# Patient Record
Sex: Male | Born: 1955 | Race: White | Hispanic: No | Marital: Married | Smoking: Former smoker
Health system: Southern US, Community
[De-identification: ages and names within clinical notes are randomized; demographics above are authoritative.]

## PROBLEM LIST (undated history)

## (undated) DIAGNOSIS — E2749 Other adrenocortical insufficiency: Secondary | ICD-10-CM

## (undated) DIAGNOSIS — R5383 Other fatigue: Secondary | ICD-10-CM

## (undated) DIAGNOSIS — I219 Acute myocardial infarction, unspecified: Secondary | ICD-10-CM

## (undated) DIAGNOSIS — I2510 Atherosclerotic heart disease of native coronary artery without angina pectoris: Secondary | ICD-10-CM

## (undated) DIAGNOSIS — IMO0001 Reserved for inherently not codable concepts without codable children: Secondary | ICD-10-CM

## (undated) DIAGNOSIS — Z905 Acquired absence of kidney: Secondary | ICD-10-CM

## (undated) DIAGNOSIS — R413 Other amnesia: Secondary | ICD-10-CM

## (undated) DIAGNOSIS — J349 Unspecified disorder of nose and nasal sinuses: Secondary | ICD-10-CM

## (undated) DIAGNOSIS — D496 Neoplasm of unspecified behavior of brain: Secondary | ICD-10-CM

## (undated) DIAGNOSIS — Z9889 Other specified postprocedural states: Secondary | ICD-10-CM

## (undated) DIAGNOSIS — K5732 Diverticulitis of large intestine without perforation or abscess without bleeding: Secondary | ICD-10-CM

## (undated) DIAGNOSIS — C410 Malignant neoplasm of bones of skull and face: Secondary | ICD-10-CM

## (undated) DIAGNOSIS — R0600 Dyspnea, unspecified: Secondary | ICD-10-CM

## (undated) DIAGNOSIS — K439 Ventral hernia without obstruction or gangrene: Secondary | ICD-10-CM

## (undated) DIAGNOSIS — Z951 Presence of aortocoronary bypass graft: Secondary | ICD-10-CM

## (undated) HISTORY — PX: CARDIAC CATHETERIZATION: SHX172

## (undated) HISTORY — DX: Malignant neoplasm of bones of skull and face: C41.0

## (undated) HISTORY — PX: HERNIA REPAIR: SHX51

## (undated) HISTORY — PX: BRAIN SURGERY: SHX531

## (undated) HISTORY — DX: Dyspnea, unspecified: R06.00

## (undated) HISTORY — DX: Atherosclerotic heart disease of native coronary artery without angina pectoris: I25.10

## (undated) HISTORY — DX: Acquired absence of kidney: Z90.5

## (undated) HISTORY — DX: Reserved for inherently not codable concepts without codable children: IMO0001

## (undated) HISTORY — PX: CORONARY ARTERY BYPASS GRAFT: SHX141

## (undated) HISTORY — DX: Other amnesia: R41.3

## (undated) HISTORY — PX: COLOSTOMY: SHX63

## (undated) HISTORY — DX: Other adrenocortical insufficiency: E27.49

## (undated) HISTORY — DX: Other specified postprocedural states: Z98.890

## (undated) HISTORY — DX: Ventral hernia without obstruction or gangrene: K43.9

## (undated) HISTORY — DX: Unspecified disorder of nose and nasal sinuses: J34.9

## (undated) HISTORY — PX: FRACTURE SURGERY: SHX138

## (undated) HISTORY — DX: Other fatigue: R53.83

---

## 2015-01-28 ENCOUNTER — Encounter (HOSPITAL_COMMUNITY): Payer: Self-pay | Admitting: *Deleted

## 2015-01-28 ENCOUNTER — Inpatient Hospital Stay (HOSPITAL_COMMUNITY)
Admission: AD | Admit: 2015-01-28 | Discharge: 2015-02-10 | DRG: 233 | Disposition: A | Discharge status: Home / Self Care | Payer: Medicare Other | Source: Other Acute Inpatient Hospital | Attending: Cardiothoracic Surgery | Admitting: Cardiothoracic Surgery

## 2015-01-28 DIAGNOSIS — F17210 Nicotine dependence, cigarettes, uncomplicated: Secondary | ICD-10-CM | POA: Diagnosis present

## 2015-01-28 DIAGNOSIS — D696 Thrombocytopenia, unspecified: Secondary | ICD-10-CM | POA: Diagnosis not present

## 2015-01-28 DIAGNOSIS — C717 Malignant neoplasm of brain stem: Secondary | ICD-10-CM | POA: Diagnosis present

## 2015-01-28 DIAGNOSIS — E785 Hyperlipidemia, unspecified: Secondary | ICD-10-CM | POA: Diagnosis not present

## 2015-01-28 DIAGNOSIS — F17290 Nicotine dependence, other tobacco product, uncomplicated: Secondary | ICD-10-CM | POA: Diagnosis present

## 2015-01-28 DIAGNOSIS — Z79899 Other long term (current) drug therapy: Secondary | ICD-10-CM

## 2015-01-28 DIAGNOSIS — I209 Angina pectoris, unspecified: Secondary | ICD-10-CM | POA: Diagnosis not present

## 2015-01-28 DIAGNOSIS — Y92234 Operating room of hospital as the place of occurrence of the external cause: Secondary | ICD-10-CM

## 2015-01-28 DIAGNOSIS — I4891 Unspecified atrial fibrillation: Secondary | ICD-10-CM | POA: Diagnosis present

## 2015-01-28 DIAGNOSIS — Z951 Presence of aortocoronary bypass graft: Secondary | ICD-10-CM | POA: Diagnosis not present

## 2015-01-28 DIAGNOSIS — Z720 Tobacco use: Secondary | ICD-10-CM | POA: Diagnosis present

## 2015-01-28 DIAGNOSIS — J95811 Postprocedural pneumothorax: Secondary | ICD-10-CM | POA: Diagnosis not present

## 2015-01-28 DIAGNOSIS — R0602 Shortness of breath: Secondary | ICD-10-CM

## 2015-01-28 DIAGNOSIS — D496 Neoplasm of unspecified behavior of brain: Secondary | ICD-10-CM | POA: Diagnosis not present

## 2015-01-28 DIAGNOSIS — E8770 Fluid overload, unspecified: Secondary | ICD-10-CM | POA: Diagnosis not present

## 2015-01-28 DIAGNOSIS — R0789 Other chest pain: Secondary | ICD-10-CM | POA: Diagnosis not present

## 2015-01-28 DIAGNOSIS — J939 Pneumothorax, unspecified: Secondary | ICD-10-CM

## 2015-01-28 DIAGNOSIS — Z7982 Long term (current) use of aspirin: Secondary | ICD-10-CM | POA: Diagnosis not present

## 2015-01-28 DIAGNOSIS — I214 Non-ST elevation (NSTEMI) myocardial infarction: Secondary | ICD-10-CM | POA: Diagnosis present

## 2015-01-28 DIAGNOSIS — I25710 Atherosclerosis of autologous vein coronary artery bypass graft(s) with unstable angina pectoris: Principal | ICD-10-CM | POA: Diagnosis present

## 2015-01-28 DIAGNOSIS — Z9689 Presence of other specified functional implants: Secondary | ICD-10-CM

## 2015-01-28 DIAGNOSIS — L270 Generalized skin eruption due to drugs and medicaments taken internally: Secondary | ICD-10-CM | POA: Diagnosis not present

## 2015-01-28 DIAGNOSIS — I2510 Atherosclerotic heart disease of native coronary artery without angina pectoris: Secondary | ICD-10-CM | POA: Diagnosis not present

## 2015-01-28 DIAGNOSIS — Y832 Surgical operation with anastomosis, bypass or graft as the cause of abnormal reaction of the patient, or of later complication, without mention of misadventure at the time of the procedure: Secondary | ICD-10-CM | POA: Diagnosis not present

## 2015-01-28 DIAGNOSIS — T8089XA Other complications following infusion, transfusion and therapeutic injection, initial encounter: Secondary | ICD-10-CM | POA: Diagnosis not present

## 2015-01-28 DIAGNOSIS — T457X5A Adverse effect of anticoagulant antagonists, vitamin K and other coagulants, initial encounter: Secondary | ICD-10-CM | POA: Diagnosis not present

## 2015-01-28 DIAGNOSIS — I252 Old myocardial infarction: Secondary | ICD-10-CM | POA: Diagnosis not present

## 2015-01-28 DIAGNOSIS — I25110 Atherosclerotic heart disease of native coronary artery with unstable angina pectoris: Secondary | ICD-10-CM | POA: Diagnosis not present

## 2015-01-28 DIAGNOSIS — R079 Chest pain, unspecified: Secondary | ICD-10-CM

## 2015-01-28 DIAGNOSIS — D62 Acute posthemorrhagic anemia: Secondary | ICD-10-CM | POA: Diagnosis not present

## 2015-01-28 DIAGNOSIS — I213 ST elevation (STEMI) myocardial infarction of unspecified site: Secondary | ICD-10-CM

## 2015-01-28 DIAGNOSIS — N183 Chronic kidney disease, stage 3 (moderate): Secondary | ICD-10-CM | POA: Diagnosis present

## 2015-01-28 DIAGNOSIS — Z01810 Encounter for preprocedural cardiovascular examination: Secondary | ICD-10-CM | POA: Diagnosis not present

## 2015-01-28 DIAGNOSIS — Z4682 Encounter for fitting and adjustment of non-vascular catheter: Secondary | ICD-10-CM

## 2015-01-28 HISTORY — DX: Neoplasm of unspecified behavior of brain: D49.6

## 2015-01-28 HISTORY — DX: Presence of aortocoronary bypass graft: Z95.1

## 2015-01-28 HISTORY — DX: Diverticulitis of large intestine without perforation or abscess without bleeding: K57.32

## 2015-01-28 HISTORY — DX: Acute myocardial infarction, unspecified: I21.9

## 2015-01-28 MED ORDER — HEPARIN (PORCINE) IN NACL 100-0.45 UNIT/ML-% IJ SOLN
1300.0000 [IU]/h | INTRAMUSCULAR | Status: DC
  Administered 2015-01-29: 1300 [IU]/h via INTRAVENOUS
  Filled 2015-01-28 (×2): qty 250

## 2015-01-28 MED ORDER — NITROGLYCERIN 0.4 MG SL SUBL: 0.4000 mg | SUBLINGUAL_TABLET | SUBLINGUAL | Status: DC | PRN

## 2015-01-28 MED ORDER — ONDANSETRON HCL 4 MG/2ML IJ SOLN: 4.0000 mg | Freq: Four times a day (QID) | INTRAMUSCULAR | Status: DC | PRN

## 2015-01-28 MED ORDER — HYDROCODONE-ACETAMINOPHEN 10-325 MG PO TABS
1.0000 | ORAL_TABLET | ORAL | Status: DC | PRN
  Administered 2015-01-28 – 2015-01-29 (×4): 2 via ORAL
  Filled 2015-01-28 (×4): qty 2

## 2015-01-28 MED ORDER — ASPIRIN EC 81 MG PO TBEC
81.0000 mg | DELAYED_RELEASE_TABLET | Freq: Every day | ORAL | Status: DC
  Administered 2015-01-29 – 2015-02-03 (×6): 81 mg via ORAL
  Filled 2015-01-28 (×6): qty 1

## 2015-01-28 MED ORDER — ZOLPIDEM TARTRATE 5 MG PO TABS
5.0000 mg | ORAL_TABLET | Freq: Every evening | ORAL | Status: DC | PRN
  Administered 2015-01-28 – 2015-02-02 (×6): 5 mg via ORAL
  Filled 2015-01-28 (×6): qty 1

## 2015-01-28 MED ORDER — ACETAMINOPHEN 325 MG PO TABS: 650.0000 mg | ORAL_TABLET | ORAL | Status: DC | PRN

## 2015-01-28 MED ORDER — CETYLPYRIDINIUM CHLORIDE 0.05 % MT LIQD
7.0000 mL | Freq: Two times a day (BID) | OROMUCOSAL | Status: DC
  Administered 2015-02-01: 7 mL via OROMUCOSAL

## 2015-01-28 MED ORDER — ATORVASTATIN CALCIUM 80 MG PO TABS
80.0000 mg | ORAL_TABLET | Freq: Every day | ORAL | Status: DC
  Administered 2015-01-28 – 2015-02-09 (×12): 80 mg via ORAL
  Filled 2015-01-28 (×16): qty 1

## 2015-01-28 NOTE — H&P (Addendum)
[name] is an 59 y.o. male.   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 By the sixth hole he was just in the cart with his friends. He then was at the clubhouse and drove himself to the hospital. He characterized the pain as substernal to left-sided, pressure/tightness with some associated left arm pain. He thinks his pain with his MI and subsequent CABG 10 years ago was similar. He was treated in the ER with aspirin and lovenox and per history they recommended heart catheterization then but he and his wife preferred to come to [facility]. On arrival, he was chest pain free. However, when I reviewed the ECG I noted inferior ST elevation with reciprocal ST depression. He was chest pain free and has been for hours. Repeat ECG with inferior forces completed Q'ed out.   Past Medical History  Diagnosis Date  . Brain tumor     Clival Chordoma   . S/P CABG x 4     2006  . Myocardial infarction   . Diverticulitis large intestine     Past Surgical History  Procedure Laterality Date  . Coronary artery bypass graft    . Cardiac catheterization    . Brain surgery    . Hernia repair    . Fracture surgery      History reviewed. No pertinent family history. Social History:  reports that he has been smoking Cigarettes and E-cigarettes.  He has a 40 pack-year smoking history. He does not have any smokeless tobacco history on file. He reports that he does not drink alcohol or use illicit drugs.  Allergies: No Known Allergies  No prescriptions prior to admission    No results found for this or any previous visit (from the past 48 hour(s)). No results found.  Review of Systems  Constitutional: Positive for diaphoresis.  Negative for fever, chills and weight loss.  HENT: Negative for ear pain and tinnitus.   Eyes: Negative for double vision and photophobia.  Respiratory: Negative for cough and hemoptysis.   Cardiovascular: Positive for chest pain. Negative for palpitations, orthopnea and leg swelling.  Gastrointestinal: Positive for nausea and vomiting. Negative for heartburn, diarrhea and constipation.  Genitourinary: Negative for dysuria, urgency and frequency.  Musculoskeletal: Negative for myalgias and neck pain.  Skin: Negative for rash.  Neurological: Positive for headaches. Negative for dizziness, tingling and tremors.  Endo/Heme/Allergies: Negative for polydipsia. Does not bruise/bleed easily.  Psychiatric/Behavioral: Negative for depression, suicidal ideas and substance abuse.    Blood pressure 102/81, pulse 45, temperature 97.8 F (36.6 C), temperature source Core (Comment), resp. rate 16, height 6\' 2"  (1.88 m), weight 96.4 kg (212 lb 8.4 oz), SpO2 100 %. Physical Exam  Nursing note and vitals reviewed. Constitutional: He is oriented to person, place, and time. He appears well-developed and well-nourished. No distress.  HENT:  Head: Normocephalic and atraumatic.  Nose: Nose normal.  Mouth/Throat: Oropharynx is clear and moist. No oropharyngeal exudate.  Eyes: Conjunctivae and EOM are normal. Pupils are equal, round, and reactive to light. No scleral icterus.  Neck: Normal range of motion. Neck supple. No JVD present. No tracheal deviation present.  Cardiovascular: Regular rhythm, normal heart sounds and intact distal pulses.  Exam reveals  no gallop.   No murmur heard. Sinus bradycardia  Respiratory: Effort normal and breath sounds normal. No respiratory distress. He has no wheezes. He has no rales.  GI: Soft. Bowel sounds are normal. He exhibits no distension. There is no tenderness. There is no rebound.  Musculoskeletal: Normal range of motion. He exhibits no edema or tenderness.   Neurological: He is alert and oriented to person, place, and time. No cranial nerve deficit. Coordination normal.  Skin: Skin is warm and dry. No rash noted. He is not diaphoretic. No erythema.  Psychiatric: He has a normal mood and affect. His behavior is normal. Thought content normal.   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 He arrived to [facility] after 9 pm chest pain free. He appears to have had an inferior STEMI. We are currently 10 hours out and he is chest pain free with q'ed out inferior forces. I reviewed the case with my interventional cardiologist and given hemodynamic stability and 12+ hours since initiation of symptoms, favor cardiac catheterization in AM. He is on aspirin and lovenox.  Problem List STEMI/NSTEMI - chest pain free  Recent + Stress test with reversible ischemia in inferior wall  Known CAD with CABG '06 Dyslipidemia Stage III CKD ~ Cr 1.4 History of rare Brain Tumor of Brain Stem - no known upcoming surgeries Chronic Tobacco abuse Plan 1. Continue aspirin 81 mg daily, heparin gtt in AM per pharmacy (received lovenox today) 2. NPO after MN for consideration of LHC tomorrow; low threshold to activate cath lab if change in symptoms 3. Atorvastatin 80 mg qHS first dose now  4. hba1c, tsh, lipid panel, BNP 5. Echocardiogram in AM 6. Trend cardiac biomarkers  [name] 01/28/2015, 10:18 PM

## 2015-01-28 NOTE — Progress Notes (Addendum)
ANTICOAGULATION CONSULT NOTE - Initial Consult  Pharmacy Consult for Heparin Indication: chest pain/ACS  No Known Allergies  Patient Measurements: Height: 6\' 2"  (188 cm) Weight: 212 lb 8.4 oz (96.4 kg) IBW/kg (Calculated) : 82.2 Heparin Dosing Weight: 96 kg  Vital Signs: Temp: 98.1 F (36.7 C) (06/06 2339) Temp Source: Oral (06/06 2339) BP: 113/85 mmHg (06/06 2300) Pulse Rate: 50 (06/06 2300)  Labs: No results for input(s): HGB, HCT, PLT, APTT, LABPROT, INR, HEPARINUNFRC, CREATININE, CKTOTAL, CKMB, TROPONINI in the last 72 hours.  CrCl cannot be calculated (Patient has no serum creatinine result on file.).   Medical History: Past Medical History  Diagnosis Date  . Brain tumor     Clival Chordoma   . S/P CABG x 4     2006  . Myocardial infarction   . Diverticulitis large intestine     Medications:  Awaiting med rec  Assessment: 59 y.o. male presented to OSH with CP, SOB, weakness. Pt received Lovenox 97 mg at 1600 at OSH. Transferred to [facility] for cardiac cath. To begin heparin for r/o ACS.  Labs at OSH: SCr 1.37, Hgb 14.8, Hct 42.6., Plt 219, INR 1, trop 0.09  Goal of Therapy:  Heparin level 0.3-0.7 units/ml Monitor platelets by anticoagulation protocol: Yes   Plan:  Start heparin 1300 units/hr (~8 hours post Lovenox dose). No bolus. Will f/u 8 hour heparin level Daily heparin level and CBC  Sherlon Handing, PharmD, BCPS Clinical pharmacist, pager [phone] 01/28/2015,11:46 PM

## 2015-01-29 ENCOUNTER — Inpatient Hospital Stay (HOSPITAL_COMMUNITY): Payer: Medicare Other

## 2015-01-29 ENCOUNTER — Encounter (HOSPITAL_COMMUNITY): Payer: Self-pay | Admitting: Cardiology

## 2015-01-29 ENCOUNTER — Other Ambulatory Visit: Payer: Self-pay | Admitting: *Deleted

## 2015-01-29 ENCOUNTER — Encounter (HOSPITAL_COMMUNITY)
Admission: AD | Disposition: A | Discharge status: Home / Self Care | Payer: Self-pay | Source: Other Acute Inpatient Hospital | Attending: Cardiology

## 2015-01-29 DIAGNOSIS — Z720 Tobacco use: Secondary | ICD-10-CM

## 2015-01-29 DIAGNOSIS — E785 Hyperlipidemia, unspecified: Secondary | ICD-10-CM

## 2015-01-29 DIAGNOSIS — I2510 Atherosclerotic heart disease of native coronary artery without angina pectoris: Secondary | ICD-10-CM

## 2015-01-29 DIAGNOSIS — R079 Chest pain, unspecified: Secondary | ICD-10-CM

## 2015-01-29 DIAGNOSIS — D496 Neoplasm of unspecified behavior of brain: Secondary | ICD-10-CM

## 2015-01-29 DIAGNOSIS — I25110 Atherosclerotic heart disease of native coronary artery with unstable angina pectoris: Secondary | ICD-10-CM

## 2015-01-29 HISTORY — PX: CARDIAC CATHETERIZATION: SHX172

## 2015-01-29 LAB — BASIC METABOLIC PANEL
Anion gap: 7 (ref 5–15)
BUN: 17 mg/dL (ref 6–20)
CHLORIDE: 106 mmol/L (ref 101–111)
CO2: 24 mmol/L (ref 22–32)
Calcium: 8.5 mg/dL — ABNORMAL LOW (ref 8.9–10.3)
Creatinine, Ser: 1.37 mg/dL — ABNORMAL HIGH (ref 0.61–1.24)
GFR calc Af Amer: >60 mL/min (ref >60)
GFR calc non Af Amer: 55 mL/min — ABNORMAL LOW (ref >60)
Glucose, Bld: 98 mg/dL (ref 65–99)
Potassium: 4.4 mmol/L (ref 3.5–5.1)
SODIUM: 137 mmol/L (ref 135–145)

## 2015-01-29 LAB — URINALYSIS, ROUTINE W REFLEX MICROSCOPIC
Bilirubin Urine: NEGATIVE
Glucose, UA: NEGATIVE mg/dL
Hgb urine dipstick: NEGATIVE
Ketones, ur: NEGATIVE mg/dL
Leukocytes, UA: NEGATIVE
Nitrite: NEGATIVE
Protein, ur: NEGATIVE mg/dL
Specific Gravity, Urine: 1.02 (ref 1.005–1.030)
Urobilinogen, UA: 0.2 mg/dL (ref 0.0–1.0)
pH: 5 (ref 5.0–8.0)

## 2015-01-29 LAB — COMPREHENSIVE METABOLIC PANEL
ALK PHOS: 44 U/L (ref 38–126)
ALT: 21 U/L (ref 17–63)
AST: 72 U/L — ABNORMAL HIGH (ref 15–41)
Albumin: 3.3 g/dL — ABNORMAL LOW (ref 3.5–5.0)
Anion gap: 7 (ref 5–15)
BILIRUBIN TOTAL: 0.5 mg/dL (ref 0.3–1.2)
BUN: 16 mg/dL (ref 6–20)
CALCIUM: 8.7 mg/dL — AB (ref 8.9–10.3)
CHLORIDE: 105 mmol/L (ref 101–111)
CO2: 26 mmol/L (ref 22–32)
Creatinine, Ser: 1.45 mg/dL — ABNORMAL HIGH (ref 0.61–1.24)
GFR calc Af Amer: 59 mL/min — ABNORMAL LOW (ref >60)
GFR, EST NON AFRICAN AMERICAN: 51 mL/min — AB (ref >60)
GLUCOSE: 111 mg/dL — AB (ref 65–99)
Potassium: 4.9 mmol/L (ref 3.5–5.1)
Sodium: 138 mmol/L (ref 135–145)
TOTAL PROTEIN: 6.5 g/dL (ref 6.5–8.1)

## 2015-01-29 LAB — PROTIME-INR
INR: 1.22 (ref 0.00–1.49)
Prothrombin Time: 15.6 seconds — ABNORMAL HIGH (ref 11.6–15.2)

## 2015-01-29 LAB — LIPID PANEL
CHOLESTEROL: 187 mg/dL (ref 0–200)
HDL: 31 mg/dL — ABNORMAL LOW (ref >40)
LDL Cholesterol: 141 mg/dL — ABNORMAL HIGH (ref 0–99)
TRIGLYCERIDES: 76 mg/dL (ref <150)
Total CHOL/HDL Ratio: 6 RATIO
VLDL: 15 mg/dL (ref 0–40)

## 2015-01-29 LAB — CBC
HEMATOCRIT: 37.6 % — AB (ref 39.0–52.0)
Hemoglobin: 12.8 g/dL — ABNORMAL LOW (ref 13.0–17.0)
MCH: 29.9 pg (ref 26.0–34.0)
MCHC: 34 g/dL (ref 30.0–36.0)
MCV: 87.9 fL (ref 78.0–100.0)
Platelets: 162 10*3/uL (ref 150–400)
RBC: 4.28 MIL/uL (ref 4.22–5.81)
RDW: 13.9 % (ref 11.5–15.5)
WBC: 7.5 10*3/uL (ref 4.0–10.5)

## 2015-01-29 LAB — TROPONIN I
TROPONIN I: 29.26 ng/mL — AB (ref <0.031)
Troponin I: 10.71 ng/mL (ref <0.031)
Troponin I: 18.68 ng/mL (ref <0.031)

## 2015-01-29 LAB — POCT ACTIVATED CLOTTING TIME: Activated Clotting Time: 159 seconds

## 2015-01-29 LAB — TSH: TSH: 2.49 u[IU]/mL (ref 0.350–4.500)

## 2015-01-29 LAB — SURGICAL PCR SCREEN
MRSA, PCR: NEGATIVE
Staphylococcus aureus: NEGATIVE

## 2015-01-29 LAB — MAGNESIUM: Magnesium: 2.2 mg/dL (ref 1.7–2.4)

## 2015-01-29 LAB — HEPARIN LEVEL (UNFRACTIONATED): HEPARIN UNFRACTIONATED: 1.04 [IU]/mL — AB (ref 0.30–0.70)

## 2015-01-29 LAB — MRSA PCR SCREENING: MRSA BY PCR: NEGATIVE

## 2015-01-29 LAB — BRAIN NATRIURETIC PEPTIDE: B Natriuretic Peptide: 136 pg/mL — ABNORMAL HIGH (ref 0.0–100.0)

## 2015-01-29 SURGERY — LEFT HEART CATH AND CORS/GRAFTS ANGIOGRAPHY

## 2015-01-29 MED ORDER — SODIUM CHLORIDE 0.9 % IJ SOLN: 3.0000 mL | INTRAMUSCULAR | Status: DC | PRN

## 2015-01-29 MED ORDER — HEPARIN (PORCINE) IN NACL 100-0.45 UNIT/ML-% IJ SOLN
1150.0000 [IU]/h | INTRAMUSCULAR | Status: DC
Start: 2015-01-29 — End: 2015-01-30
  Administered 2015-01-29: 1150 [IU]/h via INTRAVENOUS
  Filled 2015-01-29 (×2): qty 250

## 2015-01-29 MED ORDER — HEPARIN (PORCINE) IN NACL 2-0.9 UNIT/ML-% IJ SOLN
INTRAMUSCULAR | Status: AC
  Filled 2015-01-29: qty 1500

## 2015-01-29 MED ORDER — SODIUM CHLORIDE 0.9 % IV SOLN: 250.0000 mL | INTRAVENOUS | Status: DC | PRN

## 2015-01-29 MED ORDER — B COMPLEX-C PO TABS
1.0000 | ORAL_TABLET | Freq: Every day | ORAL | Status: DC
  Administered 2015-01-29 – 2015-02-03 (×6): 1 via ORAL
  Filled 2015-01-29 (×7): qty 1

## 2015-01-29 MED ORDER — SODIUM CHLORIDE 0.9 % WEIGHT BASED INFUSION: 3.0000 mL/kg/h | INTRAVENOUS | Status: AC

## 2015-01-29 MED ORDER — FENTANYL CITRATE (PF) 100 MCG/2ML IJ SOLN
INTRAMUSCULAR | Status: DC | PRN
  Administered 2015-01-29: 25 ug via INTRAVENOUS

## 2015-01-29 MED ORDER — SODIUM CHLORIDE 0.9 % IV SOLN: INTRAVENOUS | Status: DC

## 2015-01-29 MED ORDER — DM-GUAIFENESIN ER 30-600 MG PO TB12
1.0000 | ORAL_TABLET | Freq: Two times a day (BID) | ORAL | Status: DC | PRN
  Filled 2015-01-29: qty 1

## 2015-01-29 MED ORDER — MIDAZOLAM HCL 2 MG/2ML IJ SOLN
INTRAMUSCULAR | Status: DC | PRN
  Administered 2015-01-29: 1 mg via INTRAVENOUS

## 2015-01-29 MED ORDER — MAGNESIUM OXIDE 400 (241.3 MG) MG PO TABS
200.0000 mg | ORAL_TABLET | Freq: Once | ORAL | Status: AC
  Administered 2015-01-29: 200 mg via ORAL
  Filled 2015-01-29: qty 0.5

## 2015-01-29 MED ORDER — SODIUM CHLORIDE 0.9 % IV SOLN
INTRAVENOUS | Status: DC
  Administered 2015-01-29: 100 mL/h via INTRAVENOUS

## 2015-01-29 MED ORDER — ASPIRIN 81 MG PO CHEW: 81.0000 mg | CHEWABLE_TABLET | ORAL | Status: DC

## 2015-01-29 MED ORDER — MIDAZOLAM HCL 2 MG/2ML IJ SOLN
INTRAMUSCULAR | Status: AC
  Filled 2015-01-29: qty 2

## 2015-01-29 MED ORDER — HOME MED STORE IN PYXIS: 1.0000 | Freq: Two times a day (BID) | Status: DC

## 2015-01-29 MED ORDER — FENTANYL CITRATE (PF) 100 MCG/2ML IJ SOLN
INTRAMUSCULAR | Status: AC
  Filled 2015-01-29: qty 2

## 2015-01-29 MED ORDER — SODIUM CHLORIDE 0.9 % IJ SOLN: 3.0000 mL | Freq: Two times a day (BID) | INTRAMUSCULAR | Status: DC

## 2015-01-29 MED ORDER — LIDOCAINE HCL (PF) 1 % IJ SOLN
INTRAMUSCULAR | Status: AC
  Filled 2015-01-29: qty 30

## 2015-01-29 MED ORDER — IOHEXOL 350 MG/ML SOLN
INTRAVENOUS | Status: DC | PRN
  Administered 2015-01-29: 55 mL via INTRA_ARTERIAL

## 2015-01-29 MED ORDER — NITROGLYCERIN 1 MG/10 ML FOR IR/CATH LAB
INTRA_ARTERIAL | Status: AC
  Filled 2015-01-29: qty 10

## 2015-01-29 MED ORDER — ASPIRIN 81 MG PO CHEW: 81.0000 mg | CHEWABLE_TABLET | ORAL | Status: AC

## 2015-01-29 MED ORDER — BUDESONIDE-FORMOTEROL FUMARATE 160-4.5 MCG/ACT IN AERO
2.0000 | INHALATION_SPRAY | Freq: Two times a day (BID) | RESPIRATORY_TRACT | Status: DC
  Administered 2015-01-29 – 2015-02-10 (×20): 2 via RESPIRATORY_TRACT
  Filled 2015-01-29 (×3): qty 6

## 2015-01-29 MED ORDER — SODIUM CHLORIDE 0.9 % IJ SOLN
3.0000 mL | Freq: Two times a day (BID) | INTRAMUSCULAR | Status: DC
  Administered 2015-01-30 – 2015-02-03 (×4): 3 mL via INTRAVENOUS

## 2015-01-29 SURGICAL SUPPLY — 14 items
CATH INFINITI 5 FR JL3.5 (CATHETERS) IMPLANT
CATH INFINITI 5FR ANG PIGTAIL (CATHETERS) IMPLANT
CATH INFINITI 5FR MULTPACK ANG (CATHETERS) ×3 IMPLANT
CATH INFINITI JR4 5F (CATHETERS) IMPLANT
DEVICE RAD COMP TR BAND LRG (VASCULAR PRODUCTS) IMPLANT
GLIDESHEATH SLEND SS 6F .021 (SHEATH) IMPLANT
KIT HEART LEFT (KITS) ×3 IMPLANT
PACK CARDIAC CATHETERIZATION (CUSTOM PROCEDURE TRAY) ×3 IMPLANT
SHEATH PINNACLE 5F 10CM (SHEATH) ×3 IMPLANT
SYR MEDRAD MARK V 150ML (SYRINGE) ×3 IMPLANT
TRANSDUCER W/STOPCOCK (MISCELLANEOUS) ×3 IMPLANT
TUBING CIL FLEX 10 FLL-RA (TUBING) IMPLANT
WIRE EMERALD 3MM-J .035X150CM (WIRE) ×3 IMPLANT
WIRE SAFE-T 1.5MM-J .035X260CM (WIRE) IMPLANT

## 2015-01-29 NOTE — Progress Notes (Signed)
CVTS at bedside talking with pt and wife at this time.

## 2015-01-29 NOTE — H&P (View-Only) (Signed)
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 By the sixth hole he was just in the cart with his friends. Vomit.  He then was at the clubhouse and drove himself to the hospital. He characterized the pain as substernal to left-sided, pressure/tightness with some associated left arm pain. He thinks his pain with his MI and subsequent CABG 10 years ago was similar.  Minimal to no chest pain now.   Objective:  Vital Signs in the last 24 hours: Temp:  [97.6 F (36.4 C)-98.7 F (37.1 C)] 97.6 F (36.4 C) (06/07 0754) Pulse Rate:  [40-52] 45 (06/07 0800) Resp:  [12-39] 14 (06/07 0800) BP: (80-114)/(60-85) 80/60 mmHg (06/07 0700) SpO2:  [96 %-100 %] 97 % (06/07 0800) Weight:  [212 lb 8.4 oz (96.4 kg)-215 lb 13.3 oz (97.9 kg)] 215 lb 13.3 oz (97.9 kg) (06/07 0401)  Intake/Output from previous day: 06/06 0701 - 06/07 0700 In: 308.3 [P.O.:240; I.V.:68.3] Out: 500 [Urine:500]   Physical Exam: General: Well developed, well nourished, in no acute distress. Head:  Normocephalic and atraumatic. Lungs: Clear to auscultation and percussion. Heart: Normal S1 and S2.  No murmur, rubs or gallops. Old CABG scar.  Abdomen: soft, non-tender, positive bowel sounds. Extremities: No clubbing or cyanosis. No edema. Neurologic: Alert and oriented x 3.    Lab Results:  Recent Labs  01/29/15 0655  WBC 7.5  HGB 12.8*  PLT 162    Recent Labs  01/28/15 2359 01/29/15 0655  NA 138 137  K 4.9 4.4  CL 105 106  CO2 26 24  GLUCOSE 111* 98  BUN 16 17  CREATININE 1.45* 1.37*    Recent Labs  01/28/15 2359 01/29/15 0655  TROPONINI 10.71* 18.68*   Hepatic Function Panel  Recent Labs  01/28/15 2359  PROT 6.5  ALBUMIN 3.3*  AST 72*  ALT 21  ALKPHOS 44  BILITOT 0.5     Recent Labs  01/29/15 0655  CHOL 187    Telemetry: No VT Personally viewed.   EKG:  Currently ecg with inferior Q, no ST elevation in inferior leads. TWI anterior leads.   Cardiac Studies:  ECHO prelim inferior WMA (per Candice)  Scheduled Meds: . antiseptic oral rinse  7 mL Mouth Rinse BID  . aspirin EC  81 mg Oral Daily  . atorvastatin  80 mg Oral q1800   Continuous Infusions: . heparin 1,300 Units/hr (01/29/15 0045)   PRN Meds:.acetaminophen, HYDROcodone-acetaminophen, nitroGLYCERIN, ondansetron (ZOFRAN) IV, zolpidem  Assessment/Plan:  Principal Problem:   NSTEMI (non-ST elevated myocardial infarction) Active Problems:   Chest pain   Coronary artery disease   Dyslipidemia   Tobacco abuse  NSTEMI  - ASA HEP IV, no Beta blocker with hypotension and bradycardia  - inferior wall. ? RV involvement with hypotension  - IVF bolus  - Cath LHC (groin or left radial (prior CABG x 4))  - Recent stress test in [place] abnormal inf ischemia  - ECG yesterday with mild ST elevation inf. Discussed with interventional cards last night (see Dr. Claiborne Billings note).  Brain stem tumor  - stable, [facility], CT scan every 6 months. Prior surgery. Plays golf.   Hyperlipidemia  - atorva 80  Discussed with family. Risks and benefits discussed (stroke, MI, death, renal, bleeding)  willing to proceed.   Discussed with Dr. [place] and cath lab team   [place], [place] 01/29/2015, 9:36 AM

## 2015-01-29 NOTE — Progress Notes (Signed)
CRITICAL VALUE ALERT  Critical value received:  Troponin 10.71  Date of notification:  01/29/15  Time of notification:  0130  Critical value read back:Yes.    Nurse who received alert: Fayrene Fearing  MD notified (1st page):  Cards Fellow  Time of first page:  0131  Expected value

## 2015-01-29 NOTE — Progress Notes (Signed)
MD bedside discussing cath with patient and family, Dr Marlou Porch over to cath to see when pt can get in, cath lab called RN and can take pt now. No cath orders in computer yet, however heparin gtt off on arrival to cath lab, CHG bath completed before departure from unit, 2 IVs placed, IVF initiated. ASA given this am.

## 2015-01-29 NOTE — Interval H&P Note (Signed)
History and Physical Interval Note:  01/29/2015 10:01 AM  [name]  has presented today for surgery, with the diagnosis of cp  The various methods of treatment have been discussed with the patient and family. After consideration of risks, benefits and other options for treatment, the patient has consented to  Procedure(s): Left Heart Cath and Coronary Angiography (N/A) as a surgical intervention .  The patient's history has been reviewed, patient examined, no change in status, stable for surgery.  I have reviewed the patient's chart and labs.  Questions were answered to the patient's satisfaction.   Cath Lab Visit (complete for each Cath Lab visit)  Clinical Evaluation Leading to the Procedure:   ACS: Yes.    Non-ACS:    Anginal Classification: CCS IV  Anti-ischemic medical therapy: No Therapy  Non-Invasive Test Results: No non-invasive testing performed  Prior CABG: Previous CABG        [name] [facility] 01/29/2015 10:01 AM

## 2015-01-29 NOTE — Progress Notes (Signed)
ANTICOAGULATION CONSULT NOTE - Follow Up Consult  Pharmacy Consult for Heparin Indication: severe 3v CAD  No Known Allergies  Patient Measurements: Height: 6\' 2"  (188 cm) Weight: 215 lb 13.3 oz (97.9 kg) IBW/kg (Calculated) : 82.2  Vital Signs: Temp: 98 F (36.7 C) (06/07 1144) Temp Source: Oral (06/07 1144) BP: 80/57 mmHg (06/07 1200) Pulse Rate: 40 (06/07 1200)  Labs:  Recent Labs  01/28/15 2359 01/29/15 0655 01/29/15 0924 01/29/15 1225  HGB  --  12.8*  --   --   HCT  --  37.6*  --   --   PLT  --  162  --   --   LABPROT  --  15.6*  --   --   INR  --  1.22  --   --   HEPARINUNFRC  --   --  1.04*  --   CREATININE 1.45* 1.37*  --   --   TROPONINI 10.71* 18.68*  --  29.26*    Estimated Creatinine Clearance: 67.5 mL/min (by C-G formula based on Cr of 1.37).  Assessment: 89yom w/ hx CABG in 2006 presented with NSTEMI and was started on IV heparin. He is now s/p cath found to have severe 3v CAD (SVG to PDA, SVG to OM, SVG to diagonal) not amenable to PCI. Heparin to resume post-cath awaiting CVTS consult for redo CABG.  Initial heparin level prior to cath was elevated at 1.04 on 1300 units/hr.  Heparin to resume 8 hours post-sheath removal, sheath removed at 1122.  Goal of Therapy:  Heparin level 0.3-0.7 units/ml Monitor platelets by anticoagulation protocol: Yes   Plan:  1) At 1930, resume heparin at 1150 units/hr 2) 6 hour heparin level 3) Daily heparin level and CBC 4) Follow up CVTS consult  Deboraha Sprang 01/29/2015,1:33 PM

## 2015-01-29 NOTE — Progress Notes (Signed)
Pt arrived back to room 2H11 post cath, right fem site CDI no s/s of bleeding or hematoma. Pt AAOx3, NAD, No pain at this time. Wife retrieved from waiting room, voices that she has spoked to MD about cath already. Will cont to monitor.

## 2015-01-29 NOTE — Care Management Note (Signed)
Case Management Note  Patient Details  Name: [name] MRN: [id number] Date of Birth: 07/01/1956  Subjective/Objective:     Adm w mi               Action/Plan: lives at home, indep playing golf pta   Expected Discharge Date:                  Expected Discharge Plan:     In-House Referral:     Discharge planning Services     Post Acute Care Choice:    Choice offered to:     DME Arranged:    DME Agency:     HH Arranged:    [place] Agency:     Status of Service:     Medicare Important Message Given:    Date Medicare IM Given:    Medicare IM give by:    Date Additional Medicare IM Given:    Additional Medicare Important Message give by:     If discussed at [place] of Stay Meetings, dates discussed:    Additional Comments: ur review done  Lacretia Leigh, RN 01/29/2015, 9:10 AM

## 2015-01-29 NOTE — Consult Note (Signed)
[address]       [place] [address]             [phone]        [name] [facility] Medical Record #[id number] Date of Birth: 03/21/1956  Referring: No ref. provider found Primary Care: Pcp Not In System    Chief Complaint:   This patient was  examined and cardiac catheterization and 2-D echocardiogram reviewed  History of Present Illness:     I was asked to evaluate this 59 year old Caucasian male who was admitted 48 hours ago for acute chest pain shortness breath and positive cardiac enzymes. His cardiac enzymes are still climbing--  troponin 1 now at 30.0.  The patient underwent urgent multivessel CABG 10 years ago at [facility] in [place]. Left IMA to LAD and vein grafts to diagonal, circumflex, and posterior descending. Over the past few weeks the patient has had increasing chest discomfort and shortness of breath with exertion. On presentation he had a non-ST elevation MI. He was placed on IV heparin and nitroglycerin. Echocardiogram showed fairly well preserved LV function without significant valvular disease. The patient underwent cardiac catheterization today by Dr. Martinique which demonstrated patent left IMA graft LAD with occlusion of the native LAD, occluded vein grafts to the diagonal OM and 95% stenosis of a heavily diseased vein graft to posterior descending. Dr. Doug Sou recommendation was redo CABG.  The patient had endoscopic vein harvest from the right leg at the time of his CABG. There is no evidence that the left leg vein was used.  The patient smokes 15 cigarettes daily. The patient has a malignant brain tumor, brain stem, previously treated with resection-craniotomy 2  followed by proton beam radiation-he is followed at [facility] and so far the scans have been stable.  He denies he had any significant problems with the first cardiac operation-no history of bleeding or anesthesia problems. Current Activity/  Functional Status: Patient remains active and was playing golf when he develops symptoms which led to his presentation to the ED.   Zubrod Score: At the time of surgery this patient's most appropriate activity status/level should be described as: []     0    Normal activity, no symptoms []     1    Restricted in physical strenuous activity but ambulatory, able to do out light work [x]     2    Ambulatory and capable of self care, unable to do work activities, up and about                 more than 50%  Of the time                            []     3    Only limited self care, in bed greater than 50% of waking hours []     4    Completely disabled, no self care, confined to bed or chair []     5    Moribund  Past Medical History  Diagnosis Date  . Brain tumor     Clival Chordoma   . S/P CABG x 4     2006  . Myocardial infarction   . Diverticulitis large intestine     Past Surgical History  Procedure Laterality Date  . Coronary artery bypass graft    . Cardiac catheterization    . Brain surgery    . Hernia  repair    . Fracture surgery    . Cardiac catheterization  01/29/2015    Procedure: Left Heart Cath and Cors/Grafts Angiography;  Surgeon: Markee Matera M Martinique, MD;  Location: [facility];  Service: Cardiovascular;;    History  Smoking status  . Current Every Day Smoker -- 1.00 packs/day for 40 years  . Types: Cigarettes, E-cigarettes  Smokeless tobacco  . Not on file    History  Alcohol Use No    History   Social History  . Marital Status: Married    Spouse Name: N/A  . Number of Children: N/A  . Years of Education: N/A   Occupational History  . Not on file.   Social History Main Topics  . Smoking status: Current Every Day Smoker -- 1.00 packs/day for 40 years    Types: Cigarettes, E-cigarettes  . Smokeless tobacco: Not on file  . Alcohol Use: No  . Drug Use: No  . Sexual Activity: Not on file   Other Topics Concern  . Not on file   Social History Narrative     No Known Allergies  Current Facility-Administered Medications  Medication Dose Route Frequency Provider Last Rate Last Dose  . 0.9 %  sodium chloride infusion  250 mL Intravenous PRN Lorik Guo M Martinique, MD      . acetaminophen (TYLENOL) tablet 650 mg  650 mg Oral Q4H PRN Jules Husbands, MD      . antiseptic oral rinse (CPC / CETYLPYRIDINIUM CHLORIDE 0.05%) solution 7 mL  7 mL Mouth Rinse BID Sherren Mocha, MD   7 mL at 01/28/15 2200  . aspirin EC tablet 81 mg  81 mg Oral Daily Jules Husbands, MD   81 mg at 01/29/15 2671  . atorvastatin (LIPITOR) tablet 80 mg  80 mg Oral q1800 Jules Husbands, MD   80 mg at 01/28/15 2349  . heparin ADULT infusion 100 units/mL (25000 units/250 mL)  1,150 Units/hr Intravenous Continuous Otilio Miu, RPH      . HYDROcodone-acetaminophen (NORCO) 10-325 MG per tablet 1-2 tablet  1-2 tablet Oral Q4H PRN Jules Husbands, MD   2 tablet at 01/29/15 1515  . nitroGLYCERIN (NITROSTAT) SL tablet 0.4 mg  0.4 mg Sublingual Q5 Min x 3 PRN Jules Husbands, MD      . ondansetron [facility]) injection 4 mg  4 mg Intravenous Q6H PRN Jules Husbands, MD      . sodium chloride 0.9 % injection 3 mL  3 mL Intravenous Q12H Trixy Loyola M Martinique, MD   0 mL at 01/29/15 1445  . sodium chloride 0.9 % injection 3 mL  3 mL Intravenous PRN Yanis Larin M Martinique, MD      . zolpidem [facility]) tablet 5 mg  5 mg Oral QHS PRN Jules Husbands, MD   5 mg at 01/28/15 2343    Prescriptions prior to admission  Medication Sig Dispense Refill Last Dose  . HYDROcodone-acetaminophen (NORCO) 10-325 MG per tablet Take 1 tablet by mouth 2 (two) times daily as needed for severe pain.    Past Week at Unknown time  . Riboflavin-Magnesium-Feverfew 200-180-50 MG TABS Take 1 tablet by mouth 2 (two) times daily.   01/28/2015 at Unknown time  . zolpidem (AMBIEN) 10 MG tablet Take 10 mg by mouth at bedtime.    Past Week at Unknown time    History reviewed. No pertinent family history.   Review of Systems:  The patient has headaches every day from  his brain tumor. He denies weight loss  fever or edema.     Cardiac Review of Systems: Y or N  Chest Pain [  yes  ]  Resting SOB [ no  ] Exertional SOB  [ yes ]  Orthopnea [ yes ]   Pedal Edema [no   ]    Palpitations [no ] Syncope  [ no ]   Presyncope [ n o ]  General Review of Systems: [Y] = yes [  ]=no Constitional: recent weight change [  ]; anorexia [  ]; fatigue [ yes ]; nausea [  ]; night sweats [  ]; fever [  ]; or chills [  ]                                                               Dental: poor dentition[  ]; Last Dentist visit: 6 months   Eye : blurred vision [  ]; diplopia [   ]; vision changes [  ];  Amaurosis fugax[  ]; Resp: cough [  ];  wheezing[  ];  hemoptysis[  ]; shortness of breath[  ]; paroxysmal nocturnal dyspnea[  ]; dyspnea on exertion[  yes]; or orthopnea[  ];  GI:  gallstones[  ], vomiting[  ];  dysphagia[  ]; melena[  ];  hematochezia [  ]; heartburn[  ];   Hx of  Colonoscopy[  ]; GU: kidney stones [  ]; hematuria[  ];   dysuria [  ];  nocturia[  ];  history of     obstruction [  ]; urinary frequency [  ]             Skin: rash, swelling[  ];, hair loss[  ];  peripheral edema[  ];  or itching[  ]; Musculosketetal: myalgias[  ];  joint swelling[  ];  joint erythema[  ];  joint pain[  ];  back pain[  ];  Heme/Lymph: bruising[  ];  bleeding[  ];  anemia[  ];  Neuro: TIA[  ];  headaches[yes  ];  stroke[  ];  vertigo[  ];  seizures[  ];   paresthesias[  ];  difficulty walking[  ];  Psych:depression[  ]; anxiety[ yes ];  Endocrine: diabetes[  ];  thyroid dysfunction[  ];  Immunizations: Flu [  ]; Pneumococcal[  ];  Other: Right-hand dominant                          Cardiac catheterization performed via right femoral artery  Physical Exam: BP 96/71 mmHg  Pulse 61  Temp(Src) 97.9 F (36.6 C) (Oral)  Resp 17  Ht 6\' 2"  (1.88 m)  Wt 215 lb 13.3 oz (97.9 kg)  BMI 27.70 kg/m2  SpO2 95%      Physical Exam  General: Middle-aged pleasant Caucasian male in CCU in  no distress HEENT: Normocephalic pupils equal , dentition adequate Neck: Supple without JVD, adenopathy, or bruit Chest: Clear to auscultation, symmetrical breath sounds, no rhonchi, no tenderness             or deformity, well-healed sternal incision Cardiovascular: Regular rate and rhythm, no murmur, no gallop, peripheral pulses             palpable in all extremities Abdomen:  Soft, nontender, no palpable  mass or organomegaly Extremities: Warm, well-perfused, no clubbing cyanosis edema or tenderness,              no venous stasis changes of the legs, small scar from previous endovenous harvest the right leg at  knee Rectal/GU: Deferred Neuro: Grossly non--focal and symmetrical throughout Skin: Clean and dry without rash or ulceration    Diagnostic Studies & Laboratory data:     Recent Radiology Findings:   No results found.   I have independently reviewed the above radiologic studies.  Recent Lab Findings: Lab Results  Component Value Date   WBC 7.5 01/29/2015   HGB 12.8* 01/29/2015   HCT 37.6* 01/29/2015   PLT 162 01/29/2015   GLUCOSE 98 01/29/2015   CHOL 187 01/29/2015   TRIG 76 01/29/2015   HDL 31* 01/29/2015   LDLCALC 141* 01/29/2015   ALT 21 01/28/2015   AST 72* 01/28/2015   NA 137 01/29/2015   K 4.4 01/29/2015   CL 106 01/29/2015   CREATININE 1.37* 01/29/2015   BUN 17 01/29/2015   CO2 24 01/29/2015   TSH 2.490 01/29/2015   INR 1.22 01/29/2015      Assessment / Plan:      Unstable angina with non-ST elevation MI from subtotal occlusion of a vein graft to the posterior descending   Patent left IMA to LAD with occluded vein grafts to diagonal and OM. The OM is not a graftable vessel-occluded and atretic. The ramus intermediate or optional diagonal which was grafted before is a potential target as is the posterior descending.  After patient recovers from his MI and cardiac enzymes are cycling down we will schedule him for redo CABG 2 with vein grafts  planned to the posterior descending and optional diagonal. The patient understands that this operation will be more serious and with increased risk than his initial surgery. The patient was strongly recommended to stop smoking.        @ME1 @ 01/29/2015 5:01 PM

## 2015-01-29 NOTE — Progress Notes (Signed)
Site area: RIGHT groin 5 french sheath was removed  Site Prior to Removal:  Level 0  Pressure Applied For 20 MINUTES    Minutes Beginning at 1110a  Manual:   Yes.    Patient Status During Pull:  stable  Post Pull Groin Site:  Level 0  Post Pull Instructions Given:  Yes.    Post Pull Pulses Present:  Yes.    Dressing Applied:  Yes.    Comments:  VS rwemain stable during sheath pull.  Pt denies any discomfort at this time.

## 2015-01-29 NOTE — Progress Notes (Signed)
  Echocardiogram 2D Echocardiogram has been performed.  [name] 01/29/2015, 9:03 AM

## 2015-01-29 NOTE — Progress Notes (Signed)
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 By the sixth hole he was just in the cart with his friends. Vomit.  He then was at the clubhouse and drove himself to the hospital. He characterized the pain as substernal to left-sided, pressure/tightness with some associated left arm pain. He thinks his pain with his MI and subsequent CABG 10 years ago was similar.  Minimal to no chest pain now.   Objective:  Vital Signs in the last 24 hours: Temp:  [97.6 F (36.4 C)-98.7 F (37.1 C)] 97.6 F (36.4 C) (06/07 0754) Pulse Rate:  [40-52] 45 (06/07 0800) Resp:  [12-39] 14 (06/07 0800) BP: (80-114)/(60-85) 80/60 mmHg (06/07 0700) SpO2:  [96 %-100 %] 97 % (06/07 0800) Weight:  [212 lb 8.4 oz (96.4 kg)-215 lb 13.3 oz (97.9 kg)] 215 lb 13.3 oz (97.9 kg) (06/07 0401)  Intake/Output from previous day: 06/06 0701 - 06/07 0700 In: 308.3 [P.O.:240; I.V.:68.3] Out: 500 [Urine:500]   Physical Exam: General: Well developed, well nourished, in no acute distress. Head:  Normocephalic and atraumatic. Lungs: Clear to auscultation and percussion. Heart: Normal S1 and S2.  No murmur, rubs or gallops. Old CABG scar.  Abdomen: soft, non-tender, positive bowel sounds. Extremities: No clubbing or cyanosis. No edema. Neurologic: Alert and oriented x 3.    Lab Results:  Recent Labs  01/29/15 0655  WBC 7.5  HGB 12.8*  PLT 162    Recent Labs  01/28/15 2359 01/29/15 0655  NA 138 137  K 4.9 4.4  CL 105 106  CO2 26 24  GLUCOSE 111* 98  BUN 16 17  CREATININE 1.45* 1.37*    Recent Labs  01/28/15 2359 01/29/15 0655  TROPONINI 10.71* 18.68*   Hepatic Function Panel  Recent Labs  01/28/15 2359  PROT 6.5  ALBUMIN 3.3*  AST 72*  ALT 21  ALKPHOS 44  BILITOT 0.5     Recent Labs  01/29/15 0655  CHOL 187    Telemetry: No VT Personally viewed.   EKG:  Currently ecg with inferior Q, no ST elevation in inferior leads. TWI anterior leads.   Cardiac Studies:  ECHO prelim inferior WMA (per Candice)  Scheduled Meds: . antiseptic oral rinse  7 mL Mouth Rinse BID  . aspirin EC  81 mg Oral Daily  . atorvastatin  80 mg Oral q1800   Continuous Infusions: . heparin 1,300 Units/hr (01/29/15 0045)   PRN Meds:.acetaminophen, HYDROcodone-acetaminophen, nitroGLYCERIN, ondansetron (ZOFRAN) IV, zolpidem  Assessment/Plan:  Principal Problem:   NSTEMI (non-ST elevated myocardial infarction) Active Problems:   Chest pain   Coronary artery disease   Dyslipidemia   Tobacco abuse  NSTEMI  - ASA HEP IV, no Beta blocker with hypotension and bradycardia  - inferior wall. ? RV involvement with hypotension  - IVF bolus  - Cath LHC (groin or left radial (prior CABG x 4))  - Recent stress test in [place] abnormal inf ischemia  - ECG yesterday with mild ST elevation inf. Discussed with interventional cards last night (see Dr. Claiborne Billings note).  Brain stem tumor  - stable, [facility], CT scan every 6 months. Prior surgery. Plays golf.   Hyperlipidemia  - atorva 80  Discussed with family. Risks and benefits discussed (stroke, MI, death, renal, bleeding)  willing to proceed.   Discussed with Dr. Martinique and cath lab team   [place], [place] 01/29/2015, 9:36 AM

## 2015-01-30 ENCOUNTER — Ambulatory Visit (HOSPITAL_COMMUNITY): Payer: Medicare Other

## 2015-01-30 ENCOUNTER — Encounter (HOSPITAL_COMMUNITY): Payer: Medicare Other

## 2015-01-30 ENCOUNTER — Inpatient Hospital Stay (HOSPITAL_COMMUNITY): Payer: Medicare Other

## 2015-01-30 DIAGNOSIS — I214 Non-ST elevation (NSTEMI) myocardial infarction: Secondary | ICD-10-CM

## 2015-01-30 DIAGNOSIS — Z01810 Encounter for preprocedural cardiovascular examination: Secondary | ICD-10-CM

## 2015-01-30 DIAGNOSIS — R0789 Other chest pain: Secondary | ICD-10-CM

## 2015-01-30 LAB — BLOOD GAS, ARTERIAL
Acid-base deficit: 1.9 mmol/L (ref 0.0–2.0)
Bicarbonate: 22.6 mEq/L (ref 20.0–24.0)
Drawn by: 270271
O2 Content: 2 L/min
O2 Saturation: 99.1 %
Patient temperature: 98.6
TCO2: 23.9 mmol/L (ref 0–100)
pCO2 arterial: 40.3 mmHg (ref 35.0–45.0)
pH, Arterial: 7.368 (ref 7.350–7.450)
pO2, Arterial: 177 mmHg — ABNORMAL HIGH (ref 80.0–100.0)

## 2015-01-30 LAB — CBC
HCT: 37.1 % — ABNORMAL LOW (ref 39.0–52.0)
HEMOGLOBIN: 12.5 g/dL — AB (ref 13.0–17.0)
MCH: 30 pg (ref 26.0–34.0)
MCHC: 33.7 g/dL (ref 30.0–36.0)
MCV: 89 fL (ref 78.0–100.0)
Platelets: 146 10*3/uL — ABNORMAL LOW (ref 150–400)
RBC: 4.17 MIL/uL — ABNORMAL LOW (ref 4.22–5.81)
RDW: 14 % (ref 11.5–15.5)
WBC: 6 10*3/uL (ref 4.0–10.5)

## 2015-01-30 LAB — BASIC METABOLIC PANEL
Anion gap: 7 (ref 5–15)
BUN: 16 mg/dL (ref 6–20)
CO2: 23 mmol/L (ref 22–32)
Calcium: 8.2 mg/dL — ABNORMAL LOW (ref 8.9–10.3)
Chloride: 106 mmol/L (ref 101–111)
Creatinine, Ser: 1.3 mg/dL — ABNORMAL HIGH (ref 0.61–1.24)
GFR calc Af Amer: >60 mL/min (ref >60)
GFR calc non Af Amer: 59 mL/min — ABNORMAL LOW (ref >60)
GLUCOSE: 90 mg/dL (ref 65–99)
POTASSIUM: 4.4 mmol/L (ref 3.5–5.1)
SODIUM: 136 mmol/L (ref 135–145)

## 2015-01-30 LAB — PLATELET INHIBITION P2Y12: Platelet Function  P2Y12: 329 [PRU] (ref 194–418)

## 2015-01-30 LAB — HEMOGLOBIN A1C
HEMOGLOBIN A1C: 5.7 % — AB (ref 4.8–5.6)
Mean Plasma Glucose: 117 mg/dL

## 2015-01-30 LAB — TROPONIN I: Troponin I: 13.44 ng/mL (ref <0.031)

## 2015-01-30 LAB — HEPARIN LEVEL (UNFRACTIONATED)
HEPARIN UNFRACTIONATED: 0.29 [IU]/mL — AB (ref 0.30–0.70)
HEPARIN UNFRACTIONATED: 0.51 [IU]/mL (ref 0.30–0.70)
Heparin Unfractionated: 0.29 IU/mL — ABNORMAL LOW (ref 0.30–0.70)

## 2015-01-30 MED ORDER — HEPARIN (PORCINE) IN NACL 100-0.45 UNIT/ML-% IJ SOLN
1350.0000 [IU]/h | INTRAMUSCULAR | Status: DC
  Administered 2015-01-31 – 2015-02-03 (×6): 1350 [IU]/h via INTRAVENOUS
  Filled 2015-01-30 (×11): qty 250

## 2015-01-30 NOTE — Progress Notes (Signed)
VASCULAR LAB PRELIMINARY  PRELIMINARY  PRELIMINARY  PRELIMINARY  Pre-op Cardiac Surgery  Carotid Findings:  Bilateral:  1-39% ICA stenosis.  Vertebral artery flow is antegrade.      Upper Extremity Right Left  Brachial Pressures    Radial Waveforms pending   Ulnar Waveforms    Palmar Arch (Allen's Test)      Lower  Extremity Right Left  Dorsalis Pedis    Anterior Tibial Bilateral palpable pulses.   Posterior Tibial    Ankle/Brachial Indices     Right Lower Extremity Vein Map    Right Great Saphenous Vein   Segment Diameter Comment  1. Origin 2.6 mm   2. High Thigh 2.6 mm   3. Mid Thigh 1.83 mm   4. Low Thigh 2.19 mm   5. At Knee 1.64 mm   6. High Calf mm Lost image  7. Low Calf 1.92 mm   8. Ankle 2.88 mm    mm    mm    mm    Left Lower Extremity Vein Map    Left Great Saphenous Vein   Segment Diameter Comment  1. Origin 8.72 mm   2. High Thigh 4.38 mm   3. Mid Thigh 4.74 mm Branch   4. Low Thigh 3.28 mm   5. At Knee 4.28 mm   6. High Calf 3.1 mm Multiple branches throughout calf  7. Low Calf 3.53 mm   8. Ankle 3.34 mm    mm    mm    mm       Edwardine Deschepper, RVT 01/30/2015, 3:06 PM

## 2015-01-30 NOTE — Progress Notes (Signed)
ANTICOAGULATION CONSULT NOTE - Follow Up Consult  Pharmacy Consult for heparin Indication: CAD awaiting CABG   Labs:  Recent Labs  01/28/15 2359 01/29/15 0655 01/29/15 0924 01/29/15 1225 01/30/15 0119  HGB  --  12.8*  --   --   --   HCT  --  37.6*  --   --   --   PLT  --  162  --   --   --   LABPROT  --  15.6*  --   --   --   INR  --  1.22  --   --   --   HEPARINUNFRC  --   --  1.04*  --  0.29*  CREATININE 1.45* 1.37*  --   --   --   TROPONINI 10.71* 18.68*  --  29.26*  --     Assessment/Plan:  59yo male slightly subtherapeutic on heparin after resumed post-cath though lab was drawn <6hr after gtt started and expect to continue to accumulate. Will continue gtt at current rate and check additional level.   Wynona Neat, PharmD, BCPS  01/30/2015,2:03 AM

## 2015-01-30 NOTE — Progress Notes (Signed)
Nutrition Brief Note  Patient identified on the Malnutrition Screening Tool (MST) Report  Pt with hx of previous CABG and clival chordoma (> 10 years) admitted with NSTEMI. Pt discussed during ICU rounds and with RN. Per RN plan for repeat CABG Friday or Monday.  Pt reports about 20 lb weight loss during the last 6 months (9%) for unknown reason. Reports his PCP is aware and that there is no concern. Reports good appetite eating 3 meals per day. He has been very fatigued but he feels this is from his heart.  Discussed importance of protein. Pt declines all interventions at this time.   Wt Readings from Last 15 Encounters:  01/30/15 214 lb 1.1 oz (97.1 kg)    Body mass index is 27.47 kg/(m^2). Patient meets criteria for overweight based on current BMI.   Current diet order is Heart healthy, patient is consuming approximately >50% of meals at this time. Labs and medications reviewed.   No nutrition interventions warranted at this time. If nutrition issues arise, please consult RD.   Pole Ojea, [place], [place] Pager [phone] After Hours Pager

## 2015-01-30 NOTE — Progress Notes (Signed)
CARDIAC REHAB PHASE I   PRE:  Rate/Rhythm: 58 SR  BP:  Sitting: 99/77        SaO2: 97 AR  MODE:  Ambulation: 350 ft   POST:  Rate/Rhythm: 78 SR  BP:  Sitting: 123/81         SaO2: 98 RA  Pt agreeable to walk, family at bedside, states he walked 1 lap yesterday around the unit. Pt ambulated 350 ft on RA, IV, independent, steady gait, tolerated well. Pt insistent on pushing IV pole during walk. Began pre-op ed. Gave pt OHS book and guidelines, reviewed sternal precautions and IS. Pt and wife verbalized understanding. Pt states he suffers from short term memory loss since having brain surgery.  Will need to review pre-op education closer to surgery, limited time today (vasulcar tech at bedside to complete pre-cabg dopplers). Needs to review post-surgery activity progression as well. Will follow.   [id number]     Lenna Sciara, RN, BSN 01/30/2015 2:25 PM

## 2015-01-30 NOTE — Progress Notes (Signed)
Subjective:  Mr. Pickrell is a 59 yo man with PMH of CAD s/p CABGx4 '06 in [place], clival chordoma known for 10 years (brain tumor on brain stem followed at [facility]) that appears stable who was playing golf in the morning 01/28/15 and started feeling weak and diaphoretic after just a few holes. By the sixth hole he was just in the cart with his friends. Vomit.  He then was at the clubhouse and drove himself to the hospital. He characterized the pain as substernal to left-sided, pressure/tightness with some associated left arm pain. He thinks his pain with his MI and subsequent CABG 10 years ago was similar.  No chest pain now. Mild HA, Takes supplement for this  Objective:  Vital Signs in the last 24 hours: Temp:  [97.9 F (36.6 C)-98.5 F (36.9 C)] 98.5 F (36.9 C) (06/08 0711) Pulse Rate:  [4-66] 60 (06/08 0711) Resp:  [9-28] 12 (06/08 0800) BP: (75-110)/(51-76) 85/66 mmHg (06/08 0800) SpO2:  [91 %-100 %] 96 % (06/08 0907) Weight:  [214 lb 1.1 oz (97.1 kg)] 214 lb 1.1 oz (97.1 kg) (06/08 0436)  Intake/Output from previous day: 06/07 0701 - 06/08 0700 In: 1124.8 [P.O.:600; I.V.:524.8] Out: 525 [Urine:525]   Physical Exam: General: Well developed, well nourished, in no acute distress. Head:  Normocephalic and atraumatic. Lungs: Clear to auscultation and percussion. Heart: Normal S1 and S2.  No murmur, rubs or gallops. Old CABG scar.  Abdomen: soft, non-tender, positive bowel sounds. Extremities: No clubbing or cyanosis. No edema. Cath site normal, normal distal pulse Neurologic: Alert and oriented x 3.    Lab Results:  Recent Labs  01/29/15 0655 01/30/15 0258  WBC 7.5 6.0  HGB 12.8* 12.5*  PLT 162 146*    Recent Labs  01/29/15 0655 01/30/15 0258  NA 137 136  K 4.4 4.4  CL 106 106  CO2 24 23  GLUCOSE 98 90  BUN 17 16  CREATININE 1.37* 1.30*    Recent Labs  01/29/15 1225 01/30/15 0258  TROPONINI 29.26* 13.44*   Hepatic Function Panel  Recent Labs  01/28/15 2359  PROT 6.5  ALBUMIN 3.3*  AST 72*  ALT 21  ALKPHOS 44  BILITOT 0.5    Recent Labs  01/29/15 0655  CHOL 187    Telemetry: No VT Personally viewed.   EKG:  inferior Q, no ST elevation in inferior leads. TWI anterior leads.   Cardiac Studies:  ECHO normal EF, possible basal inf WMA  CATH: 1. Severe 3 vessel occlusive CAD 2. Patent LIMA to the LAD 3. SVG to PDA is diffusely and severely diseased with extensive ectasia and thrombosis 4. Occluded SVG to OM 5. Occluded SVG to diagonal 6. Normal LV EDP.  Recommendations: patient is currently pain free. Will assess LV function by Echocardiogram. Catheter based intervention is limited. The SVG to the PDA is not suitable for PCI. The only vessel amenable to PCI is the ramus intermediate artery. Will consult CT surgery for opinion concerning redo CABG. The ramus intermediate and PDA may be suitable for grafting. The diagonal and OM branches have only faint collaterals and would not be suitable for grafting.   Scheduled Meds: . antiseptic oral rinse  7 mL Mouth Rinse BID  . aspirin EC  81 mg Oral Daily  . atorvastatin  80 mg Oral q1800  . B-complex with vitamin C  1 tablet Oral Daily  . budesonide-formoterol  2 puff Inhalation BID  . sodium chloride  3 mL Intravenous  Q12H   Continuous Infusions: . heparin 1,150 Units/hr (01/29/15 1940)   PRN Meds:.sodium chloride, acetaminophen, dextromethorphan-guaiFENesin, HYDROcodone-acetaminophen, nitroGLYCERIN, ondansetron (ZOFRAN) IV, sodium chloride, zolpidem  Assessment/Plan:  Principal Problem:   NSTEMI (non-ST elevated myocardial infarction) Active Problems:   Chest pain   Coronary artery disease   Dyslipidemia   Tobacco abuse  NSTEMI  - ASA HEP IV, no Beta blocker with hypotension and bradycardia  - inferior wall. ? RV involvement with hypotension  - IVF gentle  - Cath as above - LIMA patent but SVG to RCA full of thrombus, scattered lesions not amenable to PCI.    - Dr. Darcey Nora note reviewed. REDO-CABG. Possible Fri or Mon.   - Troponin trending down.   - Avoiding clopidogrel.  - Recent stress test in [place] abnormal inf ischemia  - ECG on first presentation with mild ST elevation inf. Discussed with interventional cards  (see Dr. Claiborne Billings note).  Brain stem tumor  - stable, [facility], CT scan every 6 months. Prior surgery. Plays golf.   Hyperlipidemia  - atorva 80  Severe CAD  - options reviewed.Marland KitchenREDO CABG.   - awaiting CT of chest  Marlisa Caridi 01/30/2015, 10:16 AM

## 2015-01-30 NOTE — Progress Notes (Signed)
ANTICOAGULATION CONSULT NOTE - Follow Up Consult  Pharmacy Consult for Heparin Indication: severe 3v CAD  No Known Allergies  Patient Measurements: Height: 6\' 2"  (188 cm) Weight: 214 lb 1.1 oz (97.1 kg) IBW/kg (Calculated) : 82.2  Vital Signs: Temp: 98 F (36.7 C) (06/08 1600) Temp Source: Oral (06/08 1600) BP: 87/59 mmHg (06/08 1900)  Labs:  Recent Labs  01/28/15 2359 01/29/15 0655  01/29/15 1225 01/30/15 0119 01/30/15 0258 01/30/15 1048 01/30/15 2043  HGB  --  12.8*  --   --   --  12.5*  --   --   HCT  --  37.6*  --   --   --  37.1*  --   --   PLT  --  162  --   --   --  146*  --   --   LABPROT  --  15.6*  --   --   --   --   --   --   INR  --  1.22  --   --   --   --   --   --   HEPARINUNFRC  --   --   < >  --  0.29*  --  0.29* 0.51  CREATININE 1.45* 1.37*  --   --   --  1.30*  --   --   TROPONINI 10.71* 18.68*  --  29.26*  --  13.44*  --   --   < > = values in this interval not displayed.  Estimated Creatinine Clearance: 71.1 mL/min (by C-G formula based on Cr of 1.3).  Assessment: 18yom w/ hx CABG in 2006 who presented with NSTEMI underwent cath yesterday and was found to have severe 3v CAD. He was resumed on heparin. Heparin level now therapeuticl. Plan is for redo CABG on Friday or Monday. CBC is stable. No bleeding reported.  Goal of Therapy:  Heparin level 0.3-0.7 units/ml Monitor platelets by anticoagulation protocol: Yes   Plan:  1) Continue heparin at 1350 units/hr 2) Daily heparin level, CBC  Tad Moore 01/30/2015,9:42 PM

## 2015-01-30 NOTE — Progress Notes (Signed)
ANTICOAGULATION CONSULT NOTE - Follow Up Consult  Pharmacy Consult for Heparin Indication: severe 3v CAD  No Known Allergies  Patient Measurements: Height: 6\' 2"  (188 cm) Weight: 214 lb 1.1 oz (97.1 kg) IBW/kg (Calculated) : 82.2  Vital Signs: Temp: 98.5 F (36.9 C) (06/08 0711) Temp Source: Oral (06/08 0711) BP: 101/71 mmHg (06/08 1200) Pulse Rate: 60 (06/08 0711)  Labs:  Recent Labs  01/28/15 2359 01/29/15 7579 01/29/15 0924 01/29/15 1225 01/30/15 0119 01/30/15 0258 01/30/15 1048  HGB  --  12.8*  --   --   --  12.5*  --   HCT  --  37.6*  --   --   --  37.1*  --   PLT  --  162  --   --   --  146*  --   LABPROT  --  15.6*  --   --   --   --   --   INR  --  1.22  --   --   --   --   --   HEPARINUNFRC  --   --  1.04*  --  0.29*  --  0.29*  CREATININE 1.45* 1.37*  --   --   --  1.30*  --   TROPONINI 10.71* 18.68*  --  29.26*  --  13.44*  --     Estimated Creatinine Clearance: 71.1 mL/min (by C-G formula based on Cr of 1.3).   Medications:  Heparin @ 1150 units/hr  Assessment: 58yom w/ hx CABG in 2006 who presented with NSTEMI underwent cath yesterday and was found to have severe 3v CAD. He was resumed on heparin. Heparin level remains slightly below goal. Plan is for redo CABG on Friday or Monday. CBC is stable. No bleeding reported.  Goal of Therapy:  Heparin level 0.3-0.7 units/ml Monitor platelets by anticoagulation protocol: Yes   Plan:  1) Increase heparin to 1350 units/hr 2) Check 6 hour heparin level  Deboraha Sprang 01/30/2015,12:34 PM

## 2015-01-31 ENCOUNTER — Encounter (HOSPITAL_COMMUNITY): Payer: Medicare Other

## 2015-01-31 ENCOUNTER — Inpatient Hospital Stay (HOSPITAL_COMMUNITY): Payer: Medicare Other

## 2015-01-31 ENCOUNTER — Ambulatory Visit (HOSPITAL_COMMUNITY): Payer: Medicare Other

## 2015-01-31 LAB — CBC
HEMATOCRIT: 35 % — AB (ref 39.0–52.0)
HEMOGLOBIN: 11.9 g/dL — AB (ref 13.0–17.0)
MCH: 29.9 pg (ref 26.0–34.0)
MCHC: 34 g/dL (ref 30.0–36.0)
MCV: 87.9 fL (ref 78.0–100.0)
PLATELETS: 137 10*3/uL — AB (ref 150–400)
RBC: 3.98 MIL/uL — ABNORMAL LOW (ref 4.22–5.81)
RDW: 13.6 % (ref 11.5–15.5)
WBC: 5.6 10*3/uL (ref 4.0–10.5)

## 2015-01-31 LAB — SPIROMETRY WITH GRAPH
FEF 25-75 Post: 1.8 L/sec
FEF 25-75 Pre: 1.4 L/sec
FEF2575-%Change-Post: 29 %
FEF2575-%Pred-Post: 53 %
FEF2575-%Pred-Pre: 41 %
FEV1-%Change-Post: 5 %
FEV1-%Pred-Post: 55 %
FEV1-%Pred-Pre: 52 %
FEV1-Post: 2.3 L
FEV1-Pre: 2.19 L
FEV1FVC-%Change-Post: -9 %
FEV1FVC-%Pred-Pre: 81 %
FEV6-%Change-Post: 17 %
FEV6-%Pred-Post: 76 %
FEV6-%Pred-Pre: 64 %
FEV6-Post: 3.98 L
FEV6-Pre: 3.4 L
FEV6FVC-%Change-Post: 0 %
FEV6FVC-%Pred-Post: 101 %
FEV6FVC-%Pred-Pre: 100 %
FVC-%Change-Post: 16 %
FVC-%Pred-Post: 74 %
FVC-%Pred-Pre: 64 %
FVC-Post: 4.1 L
FVC-Pre: 3.52 L
Post FEV1/FVC ratio: 56 %
Post FEV6/FVC ratio: 97 %
Pre FEV1/FVC ratio: 62 %
Pre FEV6/FVC Ratio: 96 %

## 2015-01-31 LAB — HEPARIN LEVEL (UNFRACTIONATED): Heparin Unfractionated: 0.4 IU/mL (ref 0.30–0.70)

## 2015-01-31 LAB — HEMOGLOBIN A1C
Hgb A1c MFr Bld: 5.9 % — ABNORMAL HIGH (ref 4.8–5.6)
Mean Plasma Glucose: 123 mg/dL

## 2015-01-31 MED ORDER — ALBUTEROL SULFATE (2.5 MG/3ML) 0.083% IN NEBU
2.5000 mg | INHALATION_SOLUTION | Freq: Once | RESPIRATORY_TRACT | Status: AC
  Administered 2015-01-31: 2.5 mg via RESPIRATORY_TRACT

## 2015-01-31 NOTE — Progress Notes (Signed)
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 By the sixth hole he was just in the cart with his friends. Vomit.  He then was at the clubhouse and drove himself to the hospital. He characterized the pain as substernal to left-sided, pressure/tightness with some associated left arm pain. He thinks his pain with his MI and subsequent CABG 10 years ago was similar.  No chest pain now. Mild HA, Takes supplement for this  Objective:  Vital Signs in the last 24 hours: Temp:  [97.7 F (36.5 C)-98.6 F (37 C)] 98.2 F (36.8 C) (06/09 0800) Resp:  [11-22] 12 (06/09 0800) BP: (76-106)/(48-77) 93/72 mmHg (06/09 0800) SpO2:  [95 %-96 %] 95 % (06/09 0800) Weight:  [215 lb 2.7 oz (97.6 kg)] 215 lb 2.7 oz (97.6 kg) (06/09 0435)  Intake/Output from previous day: 06/08 0701 - 06/09 0700 In: 1030 [P.O.:720; I.V.:310] Out: -    Physical Exam: General: Well developed, well nourished, in no acute distress. Head:  Normocephalic and atraumatic. Lungs: Clear to auscultation and percussion. Heart: Normal S1 and S2.  No murmur, rubs or gallops. Old CABG scar.  Abdomen: soft, non-tender, positive bowel sounds. Extremities: No clubbing or cyanosis. No edema. Cath site normal, normal distal pulse Neurologic: Alert and oriented x 3.    Lab Results:  Recent Labs  01/30/15 0258 01/31/15 0250  WBC 6.0 5.6  HGB 12.5* 11.9*  PLT 146* 137*    Recent Labs  01/29/15 0655 01/30/15 0258  NA 137 136  K 4.4 4.4  CL 106 106  CO2 24 23  GLUCOSE 98 90  BUN 17 16  CREATININE 1.37* 1.30*    Recent Labs  01/29/15 1225 01/30/15 0258  TROPONINI 29.26* 13.44*   Hepatic Function Panel  Recent Labs  01/28/15 2359  PROT 6.5  ALBUMIN 3.3*  AST 72*  ALT  21  ALKPHOS 44  BILITOT 0.5    Recent Labs  01/29/15 0655  CHOL 187    Telemetry: No VT Personally viewed.   EKG:  inferior Q, no ST elevation in inferior leads. TWI anterior leads.   Cardiac Studies:  ECHO normal EF, possible basal inf WMA  CATH: 1. Severe 3 vessel occlusive CAD 2. Patent LIMA to the LAD 3. SVG to PDA is diffusely and severely diseased with extensive ectasia and thrombosis 4. Occluded SVG to OM 5. Occluded SVG to diagonal 6. Normal LV EDP.  Recommendations: patient is currently pain free. Will assess LV function by Echocardiogram. Catheter based intervention is limited. The SVG to the PDA is not suitable for PCI. The only vessel amenable to PCI is the ramus intermediate artery. Will consult CT surgery for opinion concerning redo CABG. The ramus intermediate and PDA may be suitable for grafting. The diagonal and OM branches have only faint collaterals and would not be suitable for grafting.   Scheduled Meds: . antiseptic oral rinse  7 mL Mouth Rinse BID  . aspirin EC  81 mg Oral Daily  . atorvastatin  80 mg Oral q1800  . B-complex with vitamin C  1 tablet Oral Daily  . budesonide-formoterol  2 puff Inhalation BID  . sodium chloride  3 mL Intravenous Q12H   Continuous Infusions: .  heparin 1,350 Units/hr (01/31/15 0700)   PRN Meds:.sodium chloride, acetaminophen, dextromethorphan-guaiFENesin, HYDROcodone-acetaminophen, nitroGLYCERIN, ondansetron (ZOFRAN) IV, sodium chloride, zolpidem  Assessment/Plan:  Principal Problem:   NSTEMI (non-ST elevated myocardial infarction) Active Problems:   Chest pain   Coronary artery disease   Dyslipidemia   Tobacco abuse  NSTEMI  - ASA HEP IV, no Beta blocker with hypotension and bradycardia  - inferior wall. ? RV involvement with hypotension  - IVF gentle  - Cath as above - LIMA patent but SVG to RCA full of thrombus, scattered lesions not amenable to PCI.   - Dr. Darcey Nora note reviewed. REDO-CABG. Possible Fri or  Mon.   - Troponin trending down.   - Avoiding clopidogrel.  - Recent stress test in [place] abnormal inf ischemia  - ECG on first presentation with mild ST elevation inf. Discussed with interventional cards  (see Dr. Claiborne Billings note).  Brain stem tumor  - stable, [facility], CT scan every 6 months. Prior surgery. Plays golf.   Hyperlipidemia  - atorva 80  Severe CAD  - options reviewed.Marland KitchenREDO CABG. Monday  - Carotid 1-39% mild plaque  - CT scan chest 1. No acute abnormality identified in the chest. 2. Evidence of prior myocardial infarction and CABG. 3. Mild T5 compression fracture of indeterminate acuity. 4. Small lung nodules along the minor fissure. If the patient is at high risk for bronchogenic carcinoma, follow-up chest CT at 1 year is recommended.    [name], [name] 01/31/2015, 9:00 AM

## 2015-01-31 NOTE — Progress Notes (Signed)
CARDIAC REHAB PHASE I   Pt states he already ambulated three laps this morning with his wife, declined ambulation at this time.  Pt states he will walk again today. Completed pre-op education with wife at bedside. Reviewed IS, sternal precautions, activity progression, pt and wife verbalized understanding. Pt does have short term memory loss and will need reinforcement. Pt and wife state surgery is planned for Monday. Will follow.   [id number]  Lenna Sciara, RN, BSN 01/31/2015 11:46 AM

## 2015-01-31 NOTE — Progress Notes (Signed)
  VASCULAR LAB PRELIMINARY RESULTS  Upper extremity arterial/ Allen's test   RIGHT   LEFT   PRESSURE WAVEFORM  PRESSURE WAVEFORM  101  BRACHIAL 100    Tri RADIAL  Tri   Tri ULNAR  Tri    RIGHT LEFT  PALMAR ARCH Normal Obliterates with radial compression, normal with ulnar compression    Landry Mellow, RDMS, RVT  01/31/2015 2:16 PM

## 2015-01-31 NOTE — Progress Notes (Signed)
ANTICOAGULATION CONSULT NOTE - Follow Up Consult  Pharmacy Consult for Heparin Indication: severe 3v CAD  No Known Allergies  Patient Measurements: Height: 6\' 2"  (188 cm) Weight: 215 lb 2.7 oz (97.6 kg) IBW/kg (Calculated) : 82.2  Vital Signs: Temp: 98.2 F (36.8 C) (06/09 0800) Temp Source: Oral (06/09 0800) BP: 93/72 mmHg (06/09 0800)  Labs:  Recent Labs  01/28/15 2359  01/29/15 0655  01/29/15 1225  01/30/15 0258 01/30/15 1048 01/30/15 2043 01/31/15 0250  HGB  --   < > 12.8*  --   --   --  12.5*  --   --  11.9*  HCT  --   --  37.6*  --   --   --  37.1*  --   --  35.0*  PLT  --   --  162  --   --   --  146*  --   --  137*  LABPROT  --   --  15.6*  --   --   --   --   --   --   --   INR  --   --  1.22  --   --   --   --   --   --   --   HEPARINUNFRC  --   --   --   < >  --   < >  --  0.29* 0.51 0.40  CREATININE 1.45*  --  1.37*  --   --   --  1.30*  --   --   --   TROPONINI 10.71*  --  18.68*  --  29.26*  --  13.44*  --   --   --   < > = values in this interval not displayed.  Estimated Creatinine Clearance: 71.1 mL/min (by C-G formula based on Cr of 1.3).   Medications:  Heparin @ 1350 units/hr  Assessment: 58yom w/ hx CABG in 2006 who presented with NSTEMI underwent cath 6/7 and was found to have severe 3v CAD. He was resumed on heparin. Heparin level is therapeutic. Plan is for redo CABG on Monday. CBC is stable. No bleeding reported.  Goal of Therapy:  Heparin level 0.3-0.7 units/ml Monitor platelets by anticoagulation protocol: Yes   Plan:  1) Continue heparin at 1350 units/hr 2) Follow up heparin level, CBC in AM  Deboraha Sprang 01/31/2015,9:41 AM

## 2015-02-01 ENCOUNTER — Encounter (HOSPITAL_COMMUNITY): Payer: Self-pay | Admitting: *Deleted

## 2015-02-01 ENCOUNTER — Encounter (HOSPITAL_COMMUNITY): Payer: Self-pay | Admitting: Cardiology

## 2015-02-01 LAB — CBC
HCT: 36.6 % — ABNORMAL LOW (ref 39.0–52.0)
Hemoglobin: 12.5 g/dL — ABNORMAL LOW (ref 13.0–17.0)
MCH: 30 pg (ref 26.0–34.0)
MCHC: 34.2 g/dL (ref 30.0–36.0)
MCV: 87.8 fL (ref 78.0–100.0)
PLATELETS: 142 10*3/uL — AB (ref 150–400)
RBC: 4.17 MIL/uL — ABNORMAL LOW (ref 4.22–5.81)
RDW: 13.7 % (ref 11.5–15.5)
WBC: 6.1 10*3/uL (ref 4.0–10.5)

## 2015-02-01 LAB — HEPARIN LEVEL (UNFRACTIONATED): Heparin Unfractionated: 0.52 IU/mL (ref 0.30–0.70)

## 2015-02-01 MED ORDER — LORAZEPAM 0.5 MG PO TABS: 0.5000 mg | ORAL_TABLET | ORAL | Status: DC | PRN

## 2015-02-01 NOTE — Progress Notes (Deleted)
Sample

## 2015-02-01 NOTE — Progress Notes (Signed)
    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  No chest pain of dyspnea  Objective:  Vital Signs in the last 24 hours: Temp:  [97.8 F (36.6 C)-98.4 F (36.9 C)] 97.8 F (36.6 C) (06/10 0400) Resp:  [12-16] 16 (06/09 1643) BP: (82-116)/(53-85) 96/63 mmHg (06/10 0442) SpO2:  [95 %-96 %] 95 % (06/10 0400) Weight:  [215 lb 2.7 oz (97.6 kg)] 215 lb 2.7 oz (97.6 kg) (06/10 0400)  Intake/Output from previous day: 06/09 0701 - 06/10 0700 In: 1164 [P.O.:840; I.V.:324] Out: -    Physical Exam: General: Well developed, well nourished, in no acute distress. Head:  Normal Neck: supple Lungs: CTA Heart: RRR Abdomen: soft, non-tender, not distended Extremities: No edema.  Neurologic: Alert and oriented x 3.    Lab Results:  Recent Labs  01/31/15 0250 02/01/15 0237  WBC 5.6 6.1  HGB 11.9* 12.5*  PLT 137* 142*    Recent Labs  01/30/15 0258  NA 136  K 4.4  CL 106  CO2 23  GLUCOSE 90  BUN 16  CREATININE 1.30*    Recent Labs  01/29/15 1225 01/30/15 0258  TROPONINI 29.26* 13.44*    Cardiac Studies:  ECHO normal EF, possible basal inf WMA  CATH: 1. Severe 3 vessel occlusive CAD 2. Patent LIMA to the LAD 3. SVG to PDA is diffusely and severely diseased with extensive ectasia and thrombosis 4. Occluded SVG to OM 5. Occluded SVG to diagonal 6. Normal LV EDP.  Recommendations: patient is currently pain free. Will assess LV function by Echocardiogram. Catheter based intervention is limited. The SVG to the PDA is not suitable for PCI. The only vessel amenable to PCI is the ramus intermediate artery. Will consult CT surgery for opinion concerning redo CABG. The ramus intermediate and PDA may be suitable for grafting. The diagonal and OM branches have only faint collaterals and would not be suitable for  grafting.   Scheduled Meds: . antiseptic oral rinse  7 mL Mouth Rinse BID  . aspirin EC  81 mg Oral Daily  . atorvastatin  80 mg Oral q1800  . B-complex with vitamin C  1 tablet Oral Daily  . budesonide-formoterol  2 puff Inhalation BID  . sodium chloride  3 mL Intravenous Q12H   Continuous Infusions: . heparin 1,350 Units/hr (01/31/15 2100)   PRN Meds:.sodium chloride, acetaminophen, dextromethorphan-guaiFENesin, HYDROcodone-acetaminophen, nitroGLYCERIN, ondansetron (ZOFRAN) IV, sodium chloride, zolpidem  Assessment/Plan:   NSTEMI  - Continue ASA, heparin and statin; add metoprolol later if BP allows.  - Cath as above - LIMA patent but SVG to RCA full of thrombus, scattered lesions not amenable to PCI.   - Dr. Darcey Nora note reviewed. REDO-CABG probably Monday.  Brain stem tumor  - stable, [facility], CT scan every 6 months.  Hyperlipidemia  - continue statin  Abnormal Chest CT  - will need FU CT in the future  Stage 3 chronic kidney disease  - Repeat BMET in AM  [facility] 02/01/2015, 7:32 AM

## 2015-02-01 NOTE — Progress Notes (Signed)
3 Days Post-Op Procedure(s): Left Heart Cath and Cors/Grafts Angiography Subjective: Patient oh without angina after non-ST elevation MI-his cardiac enzymes are decreasing daily. He has no symptoms of heart failure or ventricular arrhythmia. The patient is been evaluated and has completed his evaluation for redo CABG to be scheduled June 13 at [facility] hospital I discussed the procedure in detail the patient has family in the understands the benefits and the associated risks involved.  Objective: Vital signs in last 24 hours: Temp:  [97.3 F (36.3 C)-98.4 F (36.9 C)] 97.3 F (36.3 C) (06/10 1200) Pulse Rate:  [69] 69 (06/10 0912) Cardiac Rhythm:  [-] Normal sinus rhythm;Heart block (06/10 1200) Resp:  [16-18] 18 (06/10 0912) BP: (92-116)/(53-85) 98/67 mmHg (06/10 1200) SpO2:  [95 %-97 %] 97 % (06/10 1200) Weight:  [215 lb 2.7 oz (97.6 kg)] 215 lb 2.7 oz (97.6 kg) (06/10 0400)  Hemodynamic parameters for last 24 hours:  stable  Intake/Output from previous day: 06/09 0701 - 06/10 0700 In: 1164 [P.O.:840; I.V.:324] Out: -  Intake/Output this shift: Total I/O In: 667.5 [P.O.:600; I.V.:67.5] Out: -    Exam No hematoma at the cardiac cath site Alert and comfortable Lungs clear  Lab Results:  Recent Labs  01/31/15 0250 02/01/15 0237  WBC 5.6 6.1  HGB 11.9* 12.5*  HCT 35.0* 36.6*  PLT 137* 142*   BMET:  Recent Labs  01/30/15 0258  NA 136  K 4.4  CL 106  CO2 23  GLUCOSE 90  BUN 16  CREATININE 1.30*  CALCIUM 8.2*    PT/INR: No results for input(s): LABPROT, INR in the last 72 hours. ABG    Component Value Date/Time   PHART 7.368 01/30/2015 0500   HCO3 22.6 01/30/2015 0500   TCO2 23.9 01/30/2015 0500   ACIDBASEDEF 1.9 01/30/2015 0500   O2SAT 99.1 01/30/2015 0500   CBG (last 3)  No results for input(s): GLUCAP in the last 72 hours.  Assessment/Plan: S/P Procedure(s): Left Heart Cath and Cors/Grafts Angiography Redo CABG with endoscopic harvest of left leg  greater saphenous vein on Monday, June 13 first case   LOS: 4 days    Tharon Aquas Trigt III 02/01/2015

## 2015-02-01 NOTE — Progress Notes (Signed)
ANTICOAGULATION CONSULT NOTE - Follow Up Consult  Pharmacy Consult for Heparin Indication: severe 3v CAD  No Known Allergies  Patient Measurements: Height: 6\' 2"  (188 cm) Weight: 215 lb 2.7 oz (97.6 kg) IBW/kg (Calculated) : 82.2  Vital Signs: Temp: 98.1 F (36.7 C) (06/10 0700) Temp Source: Oral (06/10 0700) BP: 104/75 mmHg (06/10 0700)  Labs:  Recent Labs  01/29/15 1225  01/30/15 0258  01/30/15 2043 01/31/15 0250 02/01/15 0237  HGB  --   < > 12.5*  --   --  11.9* 12.5*  HCT  --   --  37.1*  --   --  35.0* 36.6*  PLT  --   --  146*  --   --  137* 142*  HEPARINUNFRC  --   < >  --   < > 0.51 0.40 0.52  CREATININE  --   --  1.30*  --   --   --   --   TROPONINI 29.26*  --  13.44*  --   --   --   --   < > = values in this interval not displayed.  Estimated Creatinine Clearance: 71.1 mL/min (by C-G formula based on Cr of 1.3).   Medications:  Heparin @ 1350 units/hr  Assessment: 58yom w/ hx CABG in 2006 who presented with NSTEMI underwent cath 6/7 and was found to have severe 3v CAD. He was resumed on heparin. Heparin level is therapeutic. Plan is for redo CABG on Monday. CBC is stable. No bleeding reported.  Goal of Therapy:  Heparin level 0.3-0.7 units/ml Monitor platelets by anticoagulation protocol: Yes   Plan:  1) Continue heparin at 1350 units/hr 2) Follow up heparin level, CBC in AM  Deboraha Sprang 02/01/2015,8:26 AM

## 2015-02-02 LAB — CBC
HEMATOCRIT: 36.8 % — AB (ref 39.0–52.0)
Hemoglobin: 12.8 g/dL — ABNORMAL LOW (ref 13.0–17.0)
MCH: 30.3 pg (ref 26.0–34.0)
MCHC: 34.8 g/dL (ref 30.0–36.0)
MCV: 87.2 fL (ref 78.0–100.0)
Platelets: 152 10*3/uL (ref 150–400)
RBC: 4.22 MIL/uL (ref 4.22–5.81)
RDW: 13.6 % (ref 11.5–15.5)
WBC: 6.8 10*3/uL (ref 4.0–10.5)

## 2015-02-02 LAB — BASIC METABOLIC PANEL
Anion gap: 8 (ref 5–15)
BUN: 14 mg/dL (ref 6–20)
CHLORIDE: 105 mmol/L (ref 101–111)
CO2: 24 mmol/L (ref 22–32)
Calcium: 8.9 mg/dL (ref 8.9–10.3)
Creatinine, Ser: 1.1 mg/dL (ref 0.61–1.24)
GFR calc non Af Amer: >60 mL/min (ref >60)
Glucose, Bld: 104 mg/dL — ABNORMAL HIGH (ref 65–99)
Potassium: 4.1 mmol/L (ref 3.5–5.1)
SODIUM: 137 mmol/L (ref 135–145)

## 2015-02-02 LAB — HEPARIN LEVEL (UNFRACTIONATED): HEPARIN UNFRACTIONATED: 0.46 [IU]/mL (ref 0.30–0.70)

## 2015-02-02 NOTE — Progress Notes (Signed)
ANTICOAGULATION CONSULT NOTE - Follow Up Consult  Pharmacy Consult for Heparin Indication: severe 3v CAD  No Known Allergies  Patient Measurements: Height: 6\' 2"  (188 cm) Weight: 211 lb 10.3 oz (96 kg) IBW/kg (Calculated) : 82.2  Vital Signs: Temp: 98.4 F (36.9 C) (06/11 0736) Temp Source: Oral (06/11 0736) BP: 103/67 mmHg (06/11 0736)  Labs:  Recent Labs  01/31/15 0250 02/01/15 0237 02/02/15 0241  HGB 11.9* 12.5* 12.8*  HCT 35.0* 36.6* 36.8*  PLT 137* 142* 152  HEPARINUNFRC 0.40 0.52 0.46  CREATININE  --   --  1.10    Estimated Creatinine Clearance: 84.1 mL/min (by C-G formula based on Cr of 1.1).   Medications:  Heparin @ 1350 units/hr  Assessment: 58yom w/ hx CABG in 2006 who presented with NSTEMI underwent cath 6/7 and was found to have severe 3v CAD. He was resumed on heparin. Heparin level is therapeutic. Plan is for redo CABG on Monday, 7/13. CBC is stable. No bleeding reported.  Goal of Therapy:  Heparin level 0.3-0.7 units/ml Monitor platelets by anticoagulation protocol: Yes   Plan:  Continue heparin at 1350 units/hr Daily HL/CBC Monitor s/sx of bleeding  Thank you for allowing pharmacy to be part of this patient's care team  [place], Pharm.D Clinical Pharmacy Resident Pager: [phone] 02/02/2015 .8:21 AM

## 2015-02-02 NOTE — Progress Notes (Signed)
Subjective:  No CP/SOM. NSTEMI for redo CABG  Objective:  Temp:  [97.3 F (36.3 C)-98.4 F (36.9 C)] 98.4 F (36.9 C) (06/11 0736) Pulse Rate:  [69] 69 (06/10 0912) Resp:  [16-18] 18 (06/11 0736) BP: (86-106)/(45-71) 103/67 mmHg (06/11 0736) SpO2:  [95 %-99 %] 96 % (06/11 0827) Weight:  [211 lb 10.3 oz (96 kg)] 211 lb 10.3 oz (96 kg) (06/11 0500) Weight change: -3 lb 8.4 oz (-1.6 kg)  Intake/Output from previous day: 06/10 0701 - 06/11 0700 In: 1030.5 [P.O.:720; I.V.:310.5] Out: -   Intake/Output from this shift: Total I/O In: 253.5 [P.O.:240; I.V.:13.5] Out: -   Physical Exam: General appearance: alert and no distress Neck: no adenopathy, no carotid bruit, no JVD, supple, symmetrical, trachea midline and thyroid not enlarged, symmetric, no tenderness/mass/nodules Lungs: clear to auscultation bilaterally Heart: regular rate and rhythm, S1, S2 normal, no murmur, click, rub or gallop Extremities: extremities normal, atraumatic, no cyanosis or edema  Lab Results: Results for orders placed or performed during the hospital encounter of 01/28/15 (from the past 48 hour(s))  CBC     Status: Abnormal   Collection Time: 02/01/15  2:37 AM  Result Value Ref Range   WBC 6.1 4.0 - 10.5 K/uL   RBC 4.17 (L) 4.22 - 5.81 MIL/uL   Hemoglobin 12.5 (L) 13.0 - 17.0 g/dL   HCT 36.6 (L) 39.0 - 52.0 %   MCV 87.8 78.0 - 100.0 fL   MCH 30.0 26.0 - 34.0 pg   MCHC 34.2 30.0 - 36.0 g/dL   RDW 13.7 11.5 - 15.5 %   Platelets 142 (L) 150 - 400 K/uL  Heparin level (unfractionated)     Status: None   Collection Time: 02/01/15  2:37 AM  Result Value Ref Range   Heparin Unfractionated 0.52 0.30 - 0.70 IU/mL    Comment:        IF HEPARIN RESULTS ARE BELOW EXPECTED VALUES, AND PATIENT DOSAGE HAS BEEN CONFIRMED, SUGGEST FOLLOW UP TESTING OF ANTITHROMBIN III LEVELS.   CBC     Status: Abnormal   Collection Time: 02/02/15  2:41 AM  Result Value Ref Range   WBC 6.8 4.0 - 10.5 K/uL   RBC  4.22 4.22 - 5.81 MIL/uL   Hemoglobin 12.8 (L) 13.0 - 17.0 g/dL   HCT 36.8 (L) 39.0 - 52.0 %   MCV 87.2 78.0 - 100.0 fL   MCH 30.3 26.0 - 34.0 pg   MCHC 34.8 30.0 - 36.0 g/dL   RDW 13.6 11.5 - 15.5 %   Platelets 152 150 - 400 K/uL  Heparin level (unfractionated)     Status: None   Collection Time: 02/02/15  2:41 AM  Result Value Ref Range   Heparin Unfractionated 0.46 0.30 - 0.70 IU/mL    Comment:        IF HEPARIN RESULTS ARE BELOW EXPECTED VALUES, AND PATIENT DOSAGE HAS BEEN CONFIRMED, SUGGEST FOLLOW UP TESTING OF ANTITHROMBIN III LEVELS.   Basic metabolic panel     Status: Abnormal   Collection Time: 02/02/15  2:41 AM  Result Value Ref Range   Sodium 137 135 - 145 mmol/L   Potassium 4.1 3.5 - 5.1 mmol/L   Chloride 105 101 - 111 mmol/L   CO2 24 22 - 32 mmol/L   Glucose, Bld 104 (H) 65 - 99 mg/dL   BUN 14 6 - 20 mg/dL   Creatinine, Ser 1.10 0.61 - 1.24 mg/dL   Calcium 8.9 8.9 - 10.3 mg/dL  GFR calc non Af Amer >60 >60 mL/min   GFR calc Af Amer >60 >60 mL/min    Comment: (NOTE) The eGFR has been calculated using the CKD EPI equation. This calculation has not been validated in all clinical situations. eGFR's persistently <60 mL/min signify possible Chronic Kidney Disease.    Anion gap 8 5 - 15    Imaging: Imaging results have been reviewed  EKG- SB 56 with inf TWI (I personally reviewed this EKG)  Assessment/Plan:   1. Principal Problem: 2.   NSTEMI (non-ST elevated myocardial infarction) 3. Active Problems: 4.   Chest pain 5.   Coronary artery disease 6.   Dyslipidemia 7.   Tobacco abuse 8.   Time Spent Directly with Patient:  20 minutes  Length of Stay:  LOS: 5 days   Pt 10 years S/P CAGB X [place] now admitted with CP/STEMI (Trop 30) and cath which showed all SVGs essentially occluded with patent LIMA to LAD and preserved LV fxn by 2D. No Sx. On IV Hep. Plan re do CABG Monday by PVT. OK to transfer to step down   Quay Burow 02/02/2015,  9:05 AM

## 2015-02-03 LAB — CBC
HEMATOCRIT: 36.9 % — AB (ref 39.0–52.0)
Hemoglobin: 12.6 g/dL — ABNORMAL LOW (ref 13.0–17.0)
MCH: 30 pg (ref 26.0–34.0)
MCHC: 34.1 g/dL (ref 30.0–36.0)
MCV: 87.9 fL (ref 78.0–100.0)
Platelets: 164 10*3/uL (ref 150–400)
RBC: 4.2 MIL/uL — AB (ref 4.22–5.81)
RDW: 13.7 % (ref 11.5–15.5)
WBC: 6.2 10*3/uL (ref 4.0–10.5)

## 2015-02-03 LAB — HEPARIN LEVEL (UNFRACTIONATED): Heparin Unfractionated: 0.6 IU/mL (ref 0.30–0.70)

## 2015-02-03 LAB — PREPARE RBC (CROSSMATCH)

## 2015-02-03 MED ORDER — BISACODYL 5 MG PO TBEC: 5.0000 mg | DELAYED_RELEASE_TABLET | Freq: Once | ORAL | Status: DC

## 2015-02-03 MED ORDER — VANCOMYCIN HCL 10 G IV SOLR
1500.0000 mg | INTRAVENOUS | Status: AC
  Administered 2015-02-04: 1500 mg via INTRAVENOUS
  Filled 2015-02-03 (×3): qty 1500

## 2015-02-03 MED ORDER — MAGNESIUM SULFATE 50 % IJ SOLN
40.0000 meq | INTRAMUSCULAR | Status: DC
  Filled 2015-02-03 (×2): qty 10

## 2015-02-03 MED ORDER — PHENYLEPHRINE HCL 10 MG/ML IJ SOLN
30.0000 ug/min | INTRAVENOUS | Status: DC
  Filled 2015-02-03 (×2): qty 2

## 2015-02-03 MED ORDER — POTASSIUM CHLORIDE 2 MEQ/ML IV SOLN
80.0000 meq | INTRAVENOUS | Status: DC
  Filled 2015-02-03: qty 40

## 2015-02-03 MED ORDER — CHLORHEXIDINE GLUCONATE 4 % EX LIQD
60.0000 mL | Freq: Once | CUTANEOUS | Status: AC
  Administered 2015-02-04: 4 via TOPICAL
  Filled 2015-02-03: qty 60

## 2015-02-03 MED ORDER — DIAZEPAM 5 MG PO TABS
5.0000 mg | ORAL_TABLET | Freq: Once | ORAL | Status: AC
  Administered 2015-02-04: 5 mg via ORAL
  Filled 2015-02-03: qty 1

## 2015-02-03 MED ORDER — DEXTROSE 5 % IV SOLN
0.0000 ug/min | INTRAVENOUS | Status: AC
  Administered 2015-02-04: 2 ug/min via INTRAVENOUS
  Filled 2015-02-03 (×2): qty 4

## 2015-02-03 MED ORDER — CEFUROXIME SODIUM 1.5 G IJ SOLR
1.5000 g | INTRAMUSCULAR | Status: AC
  Administered 2015-02-04: 1.5 g via INTRAVENOUS
  Administered 2015-02-04: .75 g via INTRAVENOUS
  Filled 2015-02-03 (×2): qty 1.5

## 2015-02-03 MED ORDER — PLASMA-LYTE 148 IV SOLN
INTRAVENOUS | Status: AC
  Administered 2015-02-04: 07:00:00
  Filled 2015-02-03 (×2): qty 2.5

## 2015-02-03 MED ORDER — SODIUM CHLORIDE 0.9 % IV SOLN
INTRAVENOUS | Status: AC
  Administered 2015-02-04: 1.2 [IU]/h via INTRAVENOUS
  Filled 2015-02-03 (×2): qty 2.5

## 2015-02-03 MED ORDER — DOPAMINE-DEXTROSE 3.2-5 MG/ML-% IV SOLN
0.0000 ug/kg/min | INTRAVENOUS | Status: AC
  Administered 2015-02-04: 3 ug/min via INTRAVENOUS
  Filled 2015-02-03: qty 250

## 2015-02-03 MED ORDER — DEXMEDETOMIDINE HCL IN NACL 400 MCG/100ML IV SOLN
0.1000 ug/kg/h | INTRAVENOUS | Status: AC
  Administered 2015-02-04: .2 ug/kg/h via INTRAVENOUS
  Filled 2015-02-03 (×2): qty 100

## 2015-02-03 MED ORDER — ZOLPIDEM TARTRATE 5 MG PO TABS
10.0000 mg | ORAL_TABLET | Freq: Every evening | ORAL | Status: DC | PRN
  Administered 2015-02-03: 10 mg via ORAL
  Filled 2015-02-03: qty 2

## 2015-02-03 MED ORDER — DEXTROSE 5 % IV SOLN
750.0000 mg | INTRAVENOUS | Status: DC
  Filled 2015-02-03 (×2): qty 750

## 2015-02-03 MED ORDER — AMINOCAPROIC ACID 250 MG/ML IV SOLN
INTRAVENOUS | Status: AC
  Administered 2015-02-04: 13:00:00 via INTRAVENOUS
  Administered 2015-02-04: 69.8 mL/h via INTRAVENOUS
  Filled 2015-02-03 (×2): qty 40

## 2015-02-03 MED ORDER — NITROGLYCERIN IN D5W 200-5 MCG/ML-% IV SOLN
2.0000 ug/min | INTRAVENOUS | Status: DC
  Filled 2015-02-03: qty 250

## 2015-02-03 MED ORDER — TEMAZEPAM 15 MG PO CAPS: 15.0000 mg | ORAL_CAPSULE | Freq: Once | ORAL | Status: AC | PRN

## 2015-02-03 MED ORDER — CHLORHEXIDINE GLUCONATE 4 % EX LIQD
60.0000 mL | Freq: Once | CUTANEOUS | Status: AC
  Administered 2015-02-03: 4 via TOPICAL
  Filled 2015-02-03: qty 60

## 2015-02-03 MED ORDER — SODIUM CHLORIDE 0.9 % IV SOLN
INTRAVENOUS | Status: DC
  Filled 2015-02-03 (×2): qty 30

## 2015-02-03 MED ORDER — METOPROLOL TARTRATE 12.5 MG HALF TABLET
12.5000 mg | ORAL_TABLET | Freq: Once | ORAL | Status: AC
  Administered 2015-02-04: 12.5 mg via ORAL
  Filled 2015-02-03: qty 1

## 2015-02-03 NOTE — Progress Notes (Signed)
ANTICOAGULATION CONSULT NOTE - Follow Up Consult  Pharmacy Consult for Heparin Indication: STEMI, severe 3v CAD  No Known Allergies  Patient Measurements: Height: 6\' 2"  (188 cm) Weight: 210 lb 12.8 oz (95.618 kg) IBW/kg (Calculated) : 82.2  Heparin dosing weight: 95 kg  Vital Signs: Temp: 98 F (36.7 C) (06/12 0430) Temp Source: Oral (06/12 0430) BP: 96/65 mmHg (06/12 0430) Pulse Rate: 62 (06/12 0430)  Labs:  Recent Labs  02/01/15 0237 02/02/15 0241 02/03/15 0328  HGB 12.5* 12.8* 12.6*  HCT 36.6* 36.8* 36.9*  PLT 142* 152 164  HEPARINUNFRC 0.52 0.46 0.60  CREATININE  --  1.10  --     Estimated Creatinine Clearance: 84.1 mL/min (by C-G formula based on Cr of 1.1).  Assessment: 15yom w/ hx CABG in 2006 who presented with NSTEMI underwent cath 6/7 and was found to have severe 3v CAD. He was resumed on heparin. Heparin level remains therapeutic (0.60) on 1350 units/hr. For redo CABG on Monday, 6/13.   Goal of Therapy:  Heparin level 0.3-0.7 units/ml Monitor platelets by anticoagulation protocol: Yes   Plan:   Continue heparin drip at 1350 units/hr.  Daily heparin level and CBC.  For re-do CABG in am.  Kelvin Cellar, [place] Pager: [phone] 02/03/2015 .1:00 PM

## 2015-02-03 NOTE — Progress Notes (Signed)
Patient ID: [name], male   DOB: 07-16-1956, 60 y.o.   MRN: [id number]     Subjective:  Wants normal dose of ambien at night  Objective:  Temp:  [97.7 F (36.5 C)-98.6 F (37 C)] 98 F (36.7 C) (06/12 0430) Pulse Rate:  [62-84] 62 (06/12 0430) Resp:  [18] 18 (06/12 0430) BP: (96-111)/(65-74) 96/65 mmHg (06/12 0430) SpO2:  [94 %-99 %] 97 % (06/12 0430) Weight:  [95.618 kg (210 lb 12.8 oz)] 95.618 kg (210 lb 12.8 oz) (06/12 0430) Weight change: -0.382 kg (-13.5 oz)  Intake/Output from previous day: 06/11 0701 - 06/12 0700 In: 334.5 [P.O.:240; I.V.:94.5] Out: -   Intake/Output from this shift:    Physical Exam: General appearance: alert and no distress Neck: no adenopathy, no carotid bruit, no JVD, supple, symmetrical, trachea midline and thyroid not enlarged, symmetric, no tenderness/mass/nodules Lungs: clear to auscultation bilaterally Heart: regular rate and rhythm, S1, S2 normal, no murmur, click, rub or gallop Extremities: extremities normal, atraumatic, no cyanosis or edema  Lab Results: Results for orders placed or performed during the hospital encounter of 01/28/15 (from the past 48 hour(s))  CBC     Status: Abnormal   Collection Time: 02/02/15  2:41 AM  Result Value Ref Range   WBC 6.8 4.0 - 10.5 K/uL   RBC 4.22 4.22 - 5.81 MIL/uL   Hemoglobin 12.8 (L) 13.0 - 17.0 g/dL   HCT 36.8 (L) 39.0 - 52.0 %   MCV 87.2 78.0 - 100.0 fL   MCH 30.3 26.0 - 34.0 pg   MCHC 34.8 30.0 - 36.0 g/dL   RDW 13.6 11.5 - 15.5 %   Platelets 152 150 - 400 K/uL  Heparin level (unfractionated)     Status: None   Collection Time: 02/02/15  2:41 AM  Result Value Ref Range   Heparin Unfractionated 0.46 0.30 - 0.70 IU/mL    Comment:        IF HEPARIN RESULTS ARE BELOW EXPECTED VALUES, AND PATIENT DOSAGE HAS BEEN CONFIRMED, SUGGEST FOLLOW UP TESTING OF ANTITHROMBIN III LEVELS.   Basic metabolic panel     Status: Abnormal   Collection Time: 02/02/15  2:41 AM  Result Value Ref  Range   Sodium 137 135 - 145 mmol/L   Potassium 4.1 3.5 - 5.1 mmol/L   Chloride 105 101 - 111 mmol/L   CO2 24 22 - 32 mmol/L   Glucose, Bld 104 (H) 65 - 99 mg/dL   BUN 14 6 - 20 mg/dL   Creatinine, Ser 1.10 0.61 - 1.24 mg/dL   Calcium 8.9 8.9 - 10.3 mg/dL   GFR calc non Af Amer >60 >60 mL/min   GFR calc Af Amer >60 >60 mL/min    Comment: (NOTE) The eGFR has been calculated using the CKD EPI equation. This calculation has not been validated in all clinical situations. eGFR's persistently <60 mL/min signify possible Chronic Kidney Disease.    Anion gap 8 5 - 15  CBC     Status: Abnormal   Collection Time: 02/03/15  3:28 AM  Result Value Ref Range   WBC 6.2 4.0 - 10.5 K/uL   RBC 4.20 (L) 4.22 - 5.81 MIL/uL   Hemoglobin 12.6 (L) 13.0 - 17.0 g/dL   HCT 36.9 (L) 39.0 - 52.0 %   MCV 87.9 78.0 - 100.0 fL   MCH 30.0 26.0 - 34.0 pg   MCHC 34.1 30.0 - 36.0 g/dL   RDW 13.7 11.5 - 15.5 %   Platelets 164  150 - 400 K/uL  Heparin level (unfractionated)     Status: None   Collection Time: 02/03/15  3:28 AM  Result Value Ref Range   Heparin Unfractionated 0.60 0.30 - 0.70 IU/mL    Comment:        IF HEPARIN RESULTS ARE BELOW EXPECTED VALUES, AND PATIENT DOSAGE HAS BEEN CONFIRMED, SUGGEST FOLLOW UP TESTING OF ANTITHROMBIN III LEVELS.     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 No Sx. On IV Hep. Plan re do CABG in am PVT.  Will give ambien 10 mg tonight   Jenkins Rouge 02/03/2015, 7:50  AM

## 2015-02-04 ENCOUNTER — Inpatient Hospital Stay (HOSPITAL_COMMUNITY): Payer: Medicare Other | Admitting: Anesthesiology

## 2015-02-04 ENCOUNTER — Inpatient Hospital Stay (HOSPITAL_COMMUNITY): Payer: Medicare Other

## 2015-02-04 ENCOUNTER — Encounter (HOSPITAL_COMMUNITY)
Admission: AD | Disposition: A | Discharge status: Home / Self Care | Payer: Medicare Other | Source: Other Acute Inpatient Hospital | Attending: Cardiology

## 2015-02-04 ENCOUNTER — Other Ambulatory Visit: Payer: Self-pay

## 2015-02-04 ENCOUNTER — Inpatient Hospital Stay (HOSPITAL_COMMUNITY)
Admission: AD | Admit: 2015-02-04 | Discharge: 2015-02-04 | Disposition: A | Discharge status: Home / Self Care | Payer: Medicare Other | Source: Other Acute Inpatient Hospital | Attending: Cardiothoracic Surgery | Admitting: Cardiothoracic Surgery

## 2015-02-04 DIAGNOSIS — I25110 Atherosclerotic heart disease of native coronary artery with unstable angina pectoris: Secondary | ICD-10-CM

## 2015-02-04 DIAGNOSIS — Z951 Presence of aortocoronary bypass graft: Secondary | ICD-10-CM

## 2015-02-04 HISTORY — PX: CORONARY ARTERY BYPASS GRAFT: SHX141

## 2015-02-04 HISTORY — PX: TEE WITHOUT CARDIOVERSION: SHX5443

## 2015-02-04 LAB — CBC
HCT: 30.5 % — ABNORMAL LOW (ref 39.0–52.0)
HCT: 33.7 % — ABNORMAL LOW (ref 39.0–52.0)
HCT: 37.7 % — ABNORMAL LOW (ref 39.0–52.0)
HCT: 38.1 % — ABNORMAL LOW (ref 39.0–52.0)
HEMOGLOBIN: 13.2 g/dL (ref 13.0–17.0)
Hemoglobin: 10.5 g/dL — ABNORMAL LOW (ref 13.0–17.0)
Hemoglobin: 11.5 g/dL — ABNORMAL LOW (ref 13.0–17.0)
Hemoglobin: 13 g/dL (ref 13.0–17.0)
MCH: 29.7 pg (ref 26.0–34.0)
MCH: 29.9 pg (ref 26.0–34.0)
MCH: 30 pg (ref 26.0–34.0)
MCH: 30.5 pg (ref 26.0–34.0)
MCHC: 34.1 g/dL (ref 30.0–36.0)
MCHC: 34.1 g/dL (ref 30.0–36.0)
MCHC: 34.4 g/dL (ref 30.0–36.0)
MCHC: 35 g/dL (ref 30.0–36.0)
MCV: 86.4 fL (ref 78.0–100.0)
MCV: 87.1 fL (ref 78.0–100.0)
MCV: 87.5 fL (ref 78.0–100.0)
MCV: 87.8 fL (ref 78.0–100.0)
Platelets: 147 10*3/uL — ABNORMAL LOW (ref 150–400)
Platelets: 159 10*3/uL (ref 150–400)
Platelets: 161 10*3/uL (ref 150–400)
Platelets: 165 10*3/uL (ref 150–400)
RBC: 3.53 MIL/uL — ABNORMAL LOW (ref 4.22–5.81)
RBC: 3.85 MIL/uL — ABNORMAL LOW (ref 4.22–5.81)
RBC: 4.33 MIL/uL (ref 4.22–5.81)
RBC: 4.34 MIL/uL (ref 4.22–5.81)
RDW: 13.5 % (ref 11.5–15.5)
RDW: 13.5 % (ref 11.5–15.5)
RDW: 13.6 % (ref 11.5–15.5)
RDW: 13.7 % (ref 11.5–15.5)
WBC: 17.2 10*3/uL — ABNORMAL HIGH (ref 4.0–10.5)
WBC: 21.6 10*3/uL — AB (ref 4.0–10.5)
WBC: 6.5 10*3/uL (ref 4.0–10.5)
WBC: 7.3 10*3/uL (ref 4.0–10.5)

## 2015-02-04 LAB — POCT I-STAT, CHEM 8
BUN: 20 mg/dL (ref 6–20)
BUN: 18 mg/dL (ref 6–20)
BUN: 16 mg/dL (ref 6–20)
BUN: 19 mg/dL (ref 6–20)
BUN: 18 mg/dL (ref 6–20)
BUN: 17 mg/dL (ref 6–20)
BUN: 16 mg/dL (ref 6–20)
BUN: 16 mg/dL (ref 6–20)
CALCIUM ION: 1.28 mmol/L — AB (ref 1.12–1.23)
CALCIUM ION: 1.01 mmol/L — AB (ref 1.12–1.23)
CALCIUM ION: 1.04 mmol/L — AB (ref 1.12–1.23)
CALCIUM ION: 1.09 mmol/L — AB (ref 1.12–1.23)
CHLORIDE: 102 mmol/L (ref 101–111)
CHLORIDE: 100 mmol/L — AB (ref 101–111)
CHLORIDE: 101 mmol/L (ref 101–111)
CREATININE: 1.2 mg/dL (ref 0.61–1.24)
CREATININE: 1.1 mg/dL (ref 0.61–1.24)
CREATININE: 1 mg/dL (ref 0.61–1.24)
CREATININE: 1 mg/dL (ref 0.61–1.24)
CREATININE: 1.2 mg/dL (ref 0.61–1.24)
Calcium, Ion: 1.22 mmol/L (ref 1.12–1.23)
Calcium, Ion: 1.09 mmol/L — ABNORMAL LOW (ref 1.12–1.23)
Calcium, Ion: 1.04 mmol/L — ABNORMAL LOW (ref 1.12–1.23)
Calcium, Ion: 1.19 mmol/L (ref 1.12–1.23)
Chloride: 103 mmol/L (ref 101–111)
Chloride: 94 mmol/L — ABNORMAL LOW (ref 101–111)
Chloride: 99 mmol/L — ABNORMAL LOW (ref 101–111)
Chloride: 100 mmol/L — ABNORMAL LOW (ref 101–111)
Chloride: 100 mmol/L — ABNORMAL LOW (ref 101–111)
Creatinine, Ser: 0.9 mg/dL (ref 0.61–1.24)
Creatinine, Ser: 1 mg/dL (ref 0.61–1.24)
Creatinine, Ser: 1 mg/dL (ref 0.61–1.24)
GLUCOSE: 197 mg/dL — AB (ref 65–99)
GLUCOSE: 204 mg/dL — AB (ref 65–99)
GLUCOSE: 223 mg/dL — AB (ref 65–99)
Glucose, Bld: 97 mg/dL (ref 65–99)
Glucose, Bld: 118 mg/dL — ABNORMAL HIGH (ref 65–99)
Glucose, Bld: 110 mg/dL — ABNORMAL HIGH (ref 65–99)
Glucose, Bld: 136 mg/dL — ABNORMAL HIGH (ref 65–99)
Glucose, Bld: 173 mg/dL — ABNORMAL HIGH (ref 65–99)
HCT: 35 % — ABNORMAL LOW (ref 39.0–52.0)
HCT: 33 % — ABNORMAL LOW (ref 39.0–52.0)
HCT: 24 % — ABNORMAL LOW (ref 39.0–52.0)
HCT: 25 % — ABNORMAL LOW (ref 39.0–52.0)
HCT: 23 % — ABNORMAL LOW (ref 39.0–52.0)
HCT: 31 % — ABNORMAL LOW (ref 39.0–52.0)
HCT: 30 % — ABNORMAL LOW (ref 39.0–52.0)
HEMATOCRIT: 26 % — AB (ref 39.0–52.0)
HEMOGLOBIN: 8.5 g/dL — AB (ref 13.0–17.0)
HEMOGLOBIN: 10.5 g/dL — AB (ref 13.0–17.0)
HEMOGLOBIN: 10.2 g/dL — AB (ref 13.0–17.0)
Hemoglobin: 11.9 g/dL — ABNORMAL LOW (ref 13.0–17.0)
Hemoglobin: 11.2 g/dL — ABNORMAL LOW (ref 13.0–17.0)
Hemoglobin: 8.2 g/dL — ABNORMAL LOW (ref 13.0–17.0)
Hemoglobin: 8.8 g/dL — ABNORMAL LOW (ref 13.0–17.0)
Hemoglobin: 7.8 g/dL — ABNORMAL LOW (ref 13.0–17.0)
POTASSIUM: 6 mmol/L — AB (ref 3.5–5.1)
POTASSIUM: 4.2 mmol/L (ref 3.5–5.1)
Potassium: 4.5 mmol/L (ref 3.5–5.1)
Potassium: 4.5 mmol/L (ref 3.5–5.1)
Potassium: 4.6 mmol/L (ref 3.5–5.1)
Potassium: 6 mmol/L — ABNORMAL HIGH (ref 3.5–5.1)
Potassium: 4.5 mmol/L (ref 3.5–5.1)
Potassium: 3.6 mmol/L (ref 3.5–5.1)
SODIUM: 136 mmol/L (ref 135–145)
SODIUM: 135 mmol/L (ref 135–145)
Sodium: 137 mmol/L (ref 135–145)
Sodium: 130 mmol/L — ABNORMAL LOW (ref 135–145)
Sodium: 131 mmol/L — ABNORMAL LOW (ref 135–145)
Sodium: 132 mmol/L — ABNORMAL LOW (ref 135–145)
Sodium: 136 mmol/L (ref 135–145)
Sodium: 136 mmol/L (ref 135–145)
TCO2: 23 mmol/L (ref 0–100)
TCO2: 22 mmol/L (ref 0–100)
TCO2: 23 mmol/L (ref 0–100)
TCO2: 23 mmol/L (ref 0–100)
TCO2: 22 mmol/L (ref 0–100)
TCO2: 20 mmol/L (ref 0–100)
TCO2: 20 mmol/L (ref 0–100)
TCO2: 18 mmol/L (ref 0–100)

## 2015-02-04 LAB — POCT I-STAT 3, ART BLOOD GAS (G3+)
ACID-BASE DEFICIT: 5 mmol/L — AB (ref 0.0–2.0)
Acid-base deficit: 3 mmol/L — ABNORMAL HIGH (ref 0.0–2.0)
Acid-base deficit: 5 mmol/L — ABNORMAL HIGH (ref 0.0–2.0)
Acid-base deficit: 5 mmol/L — ABNORMAL HIGH (ref 0.0–2.0)
Bicarbonate: 23.8 mEq/L (ref 20.0–24.0)
Bicarbonate: 22.9 mEq/L (ref 20.0–24.0)
Bicarbonate: 22.1 mEq/L (ref 20.0–24.0)
Bicarbonate: 20.6 mEq/L (ref 20.0–24.0)
Bicarbonate: 20.7 mEq/L (ref 20.0–24.0)
O2 SAT: 100 %
O2 SAT: 96 %
O2 Saturation: 99 %
O2 Saturation: 93 %
O2 Saturation: 94 %
PCO2 ART: 46.5 mmHg — AB (ref 35.0–45.0)
PCO2 ART: 45.6 mmHg — AB (ref 35.0–45.0)
PH ART: 7.286 — AB (ref 7.350–7.450)
PH ART: 7.33 — AB (ref 7.350–7.450)
PO2 ART: 155 mmHg — AB (ref 80.0–100.0)
PO2 ART: 69 mmHg — AB (ref 80.0–100.0)
Patient temperature: 35.4
Patient temperature: 36.4
TCO2: 25 mmol/L (ref 0–100)
TCO2: 24 mmol/L (ref 0–100)
TCO2: 24 mmol/L (ref 0–100)
TCO2: 22 mmol/L (ref 0–100)
TCO2: 22 mmol/L (ref 0–100)
pCO2 arterial: 34 mmHg — ABNORMAL LOW (ref 35.0–45.0)
pCO2 arterial: 38.8 mmHg (ref 35.0–45.0)
pCO2 arterial: 39.4 mmHg (ref 35.0–45.0)
pH, Arterial: 7.453 — ABNORMAL HIGH (ref 7.350–7.450)
pH, Arterial: 7.3 — ABNORMAL LOW (ref 7.350–7.450)
pH, Arterial: 7.326 — ABNORMAL LOW (ref 7.350–7.450)
pO2, Arterial: 388 mmHg — ABNORMAL HIGH (ref 80.0–100.0)
pO2, Arterial: 74 mmHg — ABNORMAL LOW (ref 80.0–100.0)
pO2, Arterial: 83 mmHg (ref 80.0–100.0)

## 2015-02-04 LAB — BASIC METABOLIC PANEL
Anion gap: 6 (ref 5–15)
BUN: 20 mg/dL (ref 6–20)
CO2: 29 mmol/L (ref 22–32)
Calcium: 9.1 mg/dL (ref 8.9–10.3)
Chloride: 102 mmol/L (ref 101–111)
Creatinine, Ser: 1.49 mg/dL — ABNORMAL HIGH (ref 0.61–1.24)
GFR calc Af Amer: 58 mL/min — ABNORMAL LOW (ref >60)
GFR calc non Af Amer: 50 mL/min — ABNORMAL LOW (ref >60)
Glucose, Bld: 112 mg/dL — ABNORMAL HIGH (ref 65–99)
Potassium: 4.9 mmol/L (ref 3.5–5.1)
Sodium: 137 mmol/L (ref 135–145)

## 2015-02-04 LAB — POCT I-STAT 4, (NA,K, GLUC, HGB,HCT)
Glucose, Bld: 194 mg/dL — ABNORMAL HIGH (ref 65–99)
HCT: 34 % — ABNORMAL LOW (ref 39.0–52.0)
Hemoglobin: 11.6 g/dL — ABNORMAL LOW (ref 13.0–17.0)
Potassium: 4 mmol/L (ref 3.5–5.1)
Sodium: 136 mmol/L (ref 135–145)

## 2015-02-04 LAB — HEMOGLOBIN AND HEMATOCRIT, BLOOD
HCT: 24 % — ABNORMAL LOW (ref 39.0–52.0)
Hemoglobin: 8.5 g/dL — ABNORMAL LOW (ref 13.0–17.0)

## 2015-02-04 LAB — CREATININE, SERUM
Creatinine, Ser: 1.46 mg/dL — ABNORMAL HIGH (ref 0.61–1.24)
GFR calc Af Amer: 59 mL/min — ABNORMAL LOW (ref >60)
GFR calc non Af Amer: 51 mL/min — ABNORMAL LOW (ref >60)

## 2015-02-04 LAB — GLUCOSE, CAPILLARY
GLUCOSE-CAPILLARY: 144 mg/dL — AB (ref 65–99)
GLUCOSE-CAPILLARY: 183 mg/dL — AB (ref 65–99)
GLUCOSE-CAPILLARY: 205 mg/dL — AB (ref 65–99)
Glucose-Capillary: 160 mg/dL — ABNORMAL HIGH (ref 65–99)

## 2015-02-04 LAB — PLATELET COUNT: Platelets: 104 10*3/uL — ABNORMAL LOW (ref 150–400)

## 2015-02-04 LAB — ABO/RH: ABO/RH(D): A POS

## 2015-02-04 LAB — PROTIME-INR
INR: 1.47 (ref 0.00–1.49)
Prothrombin Time: 17.9 seconds — ABNORMAL HIGH (ref 11.6–15.2)

## 2015-02-04 LAB — HEPARIN LEVEL (UNFRACTIONATED): HEPARIN UNFRACTIONATED: 0.72 [IU]/mL — AB (ref 0.30–0.70)

## 2015-02-04 LAB — MAGNESIUM: Magnesium: 2.8 mg/dL — ABNORMAL HIGH (ref 1.7–2.4)

## 2015-02-04 LAB — APTT: APTT: 39 s — AB (ref 24–37)

## 2015-02-04 SURGERY — REDO CORONARY ARTERY BYPASS GRAFTING (CABG)
Anesthesia: General | Site: Chest

## 2015-02-04 MED ORDER — MIDAZOLAM HCL 2 MG/2ML IJ SOLN: 2.0000 mg | INTRAMUSCULAR | Status: DC | PRN

## 2015-02-04 MED ORDER — PROPOFOL 10 MG/ML IV BOLUS
INTRAVENOUS | Status: DC | PRN
  Administered 2015-02-04: 100 mg via INTRAVENOUS

## 2015-02-04 MED ORDER — MORPHINE SULFATE 2 MG/ML IJ SOLN: 1.0000 mg | INTRAMUSCULAR | Status: AC | PRN

## 2015-02-04 MED ORDER — SODIUM CHLORIDE 0.9 % IV SOLN
INTRAVENOUS | Status: DC
  Filled 2015-02-04: qty 20

## 2015-02-04 MED ORDER — CETYLPYRIDINIUM CHLORIDE 0.05 % MT LIQD
7.0000 mL | Freq: Four times a day (QID) | OROMUCOSAL | Status: DC
  Administered 2015-02-05: 7 mL via OROMUCOSAL

## 2015-02-04 MED ORDER — ALBUMIN HUMAN 5 % IV SOLN
INTRAVENOUS | Status: DC | PRN
Start: 2015-02-04 — End: 2015-02-04
  Administered 2015-02-04: 15:00:00 via INTRAVENOUS

## 2015-02-04 MED ORDER — NOREPINEPHRINE BITARTRATE 1 MG/ML IV SOLN
0.0000 ug/min | INTRAVENOUS | Status: DC
  Administered 2015-02-04: 18 ug/min via INTRAVENOUS
  Filled 2015-02-04: qty 16

## 2015-02-04 MED ORDER — FENTANYL CITRATE (PF) 250 MCG/5ML IJ SOLN
INTRAMUSCULAR | Status: AC
  Filled 2015-02-04: qty 5

## 2015-02-04 MED ORDER — ACETAMINOPHEN 160 MG/5ML PO SOLN: 650.0000 mg | Freq: Once | ORAL | Status: AC

## 2015-02-04 MED ORDER — OXYCODONE HCL 5 MG PO TABS
5.0000 mg | ORAL_TABLET | ORAL | Status: DC | PRN
  Administered 2015-02-05 – 2015-02-06 (×7): 10 mg via ORAL
  Filled 2015-02-04 (×7): qty 2

## 2015-02-04 MED ORDER — SODIUM CHLORIDE 0.9 % IV SOLN: 250.0000 mL | INTRAVENOUS | Status: DC

## 2015-02-04 MED ORDER — PROTAMINE SULFATE 10 MG/ML IV SOLN
INTRAVENOUS | Status: AC
  Filled 2015-02-04: qty 50

## 2015-02-04 MED ORDER — MORPHINE SULFATE 2 MG/ML IJ SOLN
2.0000 mg | INTRAMUSCULAR | Status: DC | PRN
  Administered 2015-02-04 – 2015-02-05 (×7): 2 mg via INTRAVENOUS
  Filled 2015-02-04 (×7): qty 1

## 2015-02-04 MED ORDER — GLYCOPYRROLATE 0.2 MG/ML IJ SOLN
INTRAMUSCULAR | Status: AC
  Filled 2015-02-04: qty 2

## 2015-02-04 MED ORDER — HEPARIN SODIUM (PORCINE) 1000 UNIT/ML IJ SOLN
INTRAMUSCULAR | Status: AC
  Filled 2015-02-04: qty 1

## 2015-02-04 MED ORDER — GLYCOPYRROLATE 0.2 MG/ML IJ SOLN
INTRAMUSCULAR | Status: DC | PRN
  Administered 2015-02-04: 0.1 mg via INTRAVENOUS
  Administered 2015-02-04: 0.2 mg via INTRAVENOUS
  Administered 2015-02-04: 0.1 mg via INTRAVENOUS

## 2015-02-04 MED ORDER — ALBUTEROL SULFATE HFA 108 (90 BASE) MCG/ACT IN AERS
INHALATION_SPRAY | RESPIRATORY_TRACT | Status: DC | PRN
  Administered 2015-02-04: 4 via RESPIRATORY_TRACT

## 2015-02-04 MED ORDER — ACETAMINOPHEN 160 MG/5ML PO SOLN: 1000.0000 mg | Freq: Four times a day (QID) | ORAL | Status: DC

## 2015-02-04 MED ORDER — MILRINONE IN DEXTROSE 20 MG/100ML IV SOLN
0.2500 ug/kg/min | INTRAVENOUS | Status: AC
  Administered 2015-02-04: .3 ug/kg/min via INTRAVENOUS
  Filled 2015-02-04: qty 100

## 2015-02-04 MED ORDER — PROTAMINE SULFATE 10 MG/ML IV SOLN
INTRAVENOUS | Status: AC
  Filled 2015-02-04: qty 5

## 2015-02-04 MED ORDER — SODIUM BICARBONATE 8.4 % IV SOLN
50.0000 meq | Freq: Once | INTRAVENOUS | Status: AC
  Administered 2015-02-04: 50 meq via INTRAVENOUS

## 2015-02-04 MED ORDER — MILRINONE IN DEXTROSE 20 MG/100ML IV SOLN
0.3000 ug/kg/min | INTRAVENOUS | Status: DC
  Administered 2015-02-06: 0.3 ug/kg/min via INTRAVENOUS
  Filled 2015-02-04 (×3): qty 100

## 2015-02-04 MED ORDER — LACTATED RINGERS IV SOLN
INTRAVENOUS | Status: DC | PRN
  Administered 2015-02-04 (×6): via INTRAVENOUS

## 2015-02-04 MED ORDER — HEPARIN SODIUM (PORCINE) 1000 UNIT/ML IJ SOLN
INTRAMUSCULAR | Status: DC | PRN
  Administered 2015-02-04: 30000 [IU] via INTRAVENOUS
  Administered 2015-02-04: 2000 [IU] via INTRAVENOUS

## 2015-02-04 MED ORDER — SODIUM CHLORIDE 0.9 % IJ SOLN: 10.0000 mL | INTRAMUSCULAR | Status: DC | PRN

## 2015-02-04 MED ORDER — DEXTROSE 5 % IV SOLN
0.0000 ug/min | INTRAVENOUS | Status: DC
  Administered 2015-02-04: 18 ug/min via INTRAVENOUS
  Filled 2015-02-04 (×2): qty 4

## 2015-02-04 MED ORDER — SODIUM CHLORIDE 0.9 % IV SOLN: INTRAVENOUS | Status: DC

## 2015-02-04 MED ORDER — VECURONIUM BROMIDE 10 MG IV SOLR
INTRAVENOUS | Status: AC
  Filled 2015-02-04: qty 10

## 2015-02-04 MED ORDER — NITROGLYCERIN IN D5W 200-5 MCG/ML-% IV SOLN: 0.0000 ug/min | INTRAVENOUS | Status: DC

## 2015-02-04 MED ORDER — SODIUM CHLORIDE 0.9 % IV SOLN
INTRAVENOUS | Status: DC
  Administered 2015-02-04: 4.2 [IU]/h via INTRAVENOUS
  Filled 2015-02-04 (×2): qty 2.5

## 2015-02-04 MED ORDER — PANTOPRAZOLE SODIUM 40 MG PO TBEC
40.0000 mg | DELAYED_RELEASE_TABLET | Freq: Every day | ORAL | Status: DC
  Administered 2015-02-06 – 2015-02-10 (×4): 40 mg via ORAL
  Filled 2015-02-04 (×4): qty 1

## 2015-02-04 MED ORDER — PHENYLEPHRINE 40 MCG/ML (10ML) SYRINGE FOR IV PUSH (FOR BLOOD PRESSURE SUPPORT)
PREFILLED_SYRINGE | INTRAVENOUS | Status: AC
  Filled 2015-02-04: qty 40

## 2015-02-04 MED ORDER — SODIUM CHLORIDE 0.9 % IJ SOLN
3.0000 mL | Freq: Two times a day (BID) | INTRAMUSCULAR | Status: DC
  Administered 2015-02-05 – 2015-02-09 (×6): 3 mL via INTRAVENOUS

## 2015-02-04 MED ORDER — METOPROLOL TARTRATE 25 MG/10 ML ORAL SUSPENSION
12.5000 mg | Freq: Two times a day (BID) | ORAL | Status: DC
  Filled 2015-02-04 (×11): qty 5

## 2015-02-04 MED ORDER — ROCURONIUM BROMIDE 50 MG/5ML IV SOLN
INTRAVENOUS | Status: AC
  Filled 2015-02-04: qty 1

## 2015-02-04 MED ORDER — LIDOCAINE HCL (CARDIAC) 20 MG/ML IV SOLN
INTRAVENOUS | Status: AC
  Filled 2015-02-04: qty 5

## 2015-02-04 MED ORDER — CALCIUM CHLORIDE 10 % IV SOLN
INTRAVENOUS | Status: AC
  Filled 2015-02-04: qty 10

## 2015-02-04 MED ORDER — DEXTROSE 5 % IV SOLN
1.5000 g | Freq: Two times a day (BID) | INTRAVENOUS | Status: AC
  Administered 2015-02-05 – 2015-02-06 (×3): 1.5 g via INTRAVENOUS
  Filled 2015-02-04 (×4): qty 1.5

## 2015-02-04 MED ORDER — MIDAZOLAM HCL 10 MG/2ML IJ SOLN
INTRAMUSCULAR | Status: AC
  Filled 2015-02-04: qty 2

## 2015-02-04 MED ORDER — MILRINONE IN DEXTROSE 20 MG/100ML IV SOLN
0.2500 ug/kg/min | INTRAVENOUS | Status: DC
Start: 2015-02-04 — End: 2015-02-04
  Filled 2015-02-04: qty 100

## 2015-02-04 MED ORDER — MIDAZOLAM HCL 5 MG/5ML IJ SOLN
INTRAMUSCULAR | Status: DC | PRN
  Administered 2015-02-04: 5 mg via INTRAVENOUS
  Administered 2015-02-04: 2 mg via INTRAVENOUS
  Administered 2015-02-04 (×2): 5 mg via INTRAVENOUS
  Administered 2015-02-04: 3 mg via INTRAVENOUS

## 2015-02-04 MED ORDER — EPHEDRINE SULFATE 50 MG/ML IJ SOLN
INTRAMUSCULAR | Status: AC
  Filled 2015-02-04: qty 1

## 2015-02-04 MED ORDER — INSULIN REGULAR BOLUS VIA INFUSION
0.0000 [IU] | Freq: Three times a day (TID) | INTRAVENOUS | Status: DC
  Filled 2015-02-04: qty 10

## 2015-02-04 MED ORDER — ARTIFICIAL TEARS OP OINT
TOPICAL_OINTMENT | OPHTHALMIC | Status: AC
  Filled 2015-02-04: qty 3.5

## 2015-02-04 MED ORDER — ACETAMINOPHEN 500 MG PO TABS
1000.0000 mg | ORAL_TABLET | Freq: Four times a day (QID) | ORAL | Status: DC
  Administered 2015-02-05 – 2015-02-06 (×4): 1000 mg via ORAL
  Filled 2015-02-04 (×11): qty 2

## 2015-02-04 MED ORDER — ONDANSETRON HCL 4 MG/2ML IJ SOLN: 4.0000 mg | Freq: Four times a day (QID) | INTRAMUSCULAR | Status: DC | PRN

## 2015-02-04 MED ORDER — METHYLPREDNISOLONE SODIUM SUCC 125 MG IJ SOLR
INTRAMUSCULAR | Status: DC | PRN
  Administered 2015-02-04: 80 mg via INTRAVENOUS

## 2015-02-04 MED ORDER — FENTANYL CITRATE (PF) 250 MCG/5ML IJ SOLN
INTRAMUSCULAR | Status: AC
Start: 2015-02-04 — End: 2015-02-04
  Filled 2015-02-04: qty 5

## 2015-02-04 MED ORDER — PHENYLEPHRINE HCL 10 MG/ML IJ SOLN
10.0000 mg | INTRAVENOUS | Status: DC | PRN
  Administered 2015-02-04: 50 ug/min via INTRAVENOUS

## 2015-02-04 MED ORDER — CALCIUM CHLORIDE 10 % IV SOLN
INTRAVENOUS | Status: DC | PRN
  Administered 2015-02-04 (×2): 200 mg via INTRAVENOUS

## 2015-02-04 MED ORDER — LACTATED RINGERS IV SOLN
INTRAVENOUS | Status: DC
  Administered 2015-02-04: 17:00:00 via INTRAVENOUS

## 2015-02-04 MED ORDER — PROPOFOL 10 MG/ML IV BOLUS
INTRAVENOUS | Status: AC
  Filled 2015-02-04: qty 20

## 2015-02-04 MED ORDER — LACTATED RINGERS IV SOLN: 500.0000 mL | Freq: Once | INTRAVENOUS | Status: DC | PRN

## 2015-02-04 MED ORDER — VANCOMYCIN HCL IN DEXTROSE 1-5 GM/200ML-% IV SOLN: 1000.0000 mg | Freq: Once | INTRAVENOUS | Status: DC

## 2015-02-04 MED ORDER — DESMOPRESSIN ACETATE 4 MCG/ML IJ SOLN
20.0000 ug | INTRAMUSCULAR | Status: AC
  Administered 2015-02-04: 20 ug via INTRAVENOUS
  Filled 2015-02-04: qty 5

## 2015-02-04 MED ORDER — FAMOTIDINE IN NACL 20-0.9 MG/50ML-% IV SOLN
20.0000 mg | Freq: Two times a day (BID) | INTRAVENOUS | Status: DC
  Administered 2015-02-04: 20 mg via INTRAVENOUS
  Filled 2015-02-04: qty 50

## 2015-02-04 MED ORDER — ALBUMIN HUMAN 5 % IV SOLN: 250.0000 mL | INTRAVENOUS | Status: DC | PRN

## 2015-02-04 MED ORDER — BISACODYL 5 MG PO TBEC
10.0000 mg | DELAYED_RELEASE_TABLET | Freq: Every day | ORAL | Status: DC
  Administered 2015-02-05 – 2015-02-10 (×5): 10 mg via ORAL
  Filled 2015-02-04 (×5): qty 2

## 2015-02-04 MED ORDER — METOPROLOL TARTRATE 1 MG/ML IV SOLN
2.5000 mg | INTRAVENOUS | Status: DC | PRN
Start: 2015-02-04 — End: 2015-02-10

## 2015-02-04 MED ORDER — SODIUM BICARBONATE 8.4 % IV SOLN
50.0000 meq | Freq: Once | INTRAVENOUS | Status: AC
  Administered 2015-02-04: 50 meq via INTRAVENOUS
  Filled 2015-02-04: qty 50

## 2015-02-04 MED ORDER — POTASSIUM CHLORIDE 10 MEQ/50ML IV SOLN
10.0000 meq | INTRAVENOUS | Status: AC
  Administered 2015-02-04 – 2015-02-05 (×3): 10 meq via INTRAVENOUS

## 2015-02-04 MED ORDER — ALBUMIN HUMAN 5 % IV SOLN
250.0000 mL | INTRAVENOUS | Status: AC | PRN
  Administered 2015-02-04 (×3): 250 mL via INTRAVENOUS
  Filled 2015-02-04: qty 250

## 2015-02-04 MED ORDER — CHLORHEXIDINE GLUCONATE 0.12 % MT SOLN
15.0000 mL | Freq: Two times a day (BID) | OROMUCOSAL | Status: DC
  Administered 2015-02-04: 15 mL via OROMUCOSAL
  Filled 2015-02-04: qty 15

## 2015-02-04 MED ORDER — LACTATED RINGERS IV SOLN
INTRAVENOUS | Status: DC
  Administered 2015-02-05: 23:00:00 via INTRAVENOUS

## 2015-02-04 MED ORDER — DOCUSATE SODIUM 100 MG PO CAPS
200.0000 mg | ORAL_CAPSULE | Freq: Every day | ORAL | Status: DC
  Administered 2015-02-05 – 2015-02-10 (×5): 200 mg via ORAL
  Filled 2015-02-04 (×6): qty 2

## 2015-02-04 MED ORDER — STERILE WATER FOR INJECTION IJ SOLN
INTRAMUSCULAR | Status: AC
  Filled 2015-02-04: qty 10

## 2015-02-04 MED ORDER — SODIUM CHLORIDE 0.45 % IV SOLN
INTRAVENOUS | Status: DC | PRN
  Administered 2015-02-04: 16:00:00 via INTRAVENOUS

## 2015-02-04 MED ORDER — TRAMADOL HCL 50 MG PO TABS: 50.0000 mg | ORAL_TABLET | ORAL | Status: DC | PRN

## 2015-02-04 MED ORDER — FENTANYL CITRATE (PF) 100 MCG/2ML IJ SOLN
INTRAMUSCULAR | Status: DC | PRN
  Administered 2015-02-04 (×2): 250 ug via INTRAVENOUS
  Administered 2015-02-04: 150 ug via INTRAVENOUS
  Administered 2015-02-04: 250 ug via INTRAVENOUS
  Administered 2015-02-04: 200 ug via INTRAVENOUS
  Administered 2015-02-04: 100 ug via INTRAVENOUS
  Administered 2015-02-04: 250 ug via INTRAVENOUS
  Administered 2015-02-04: 50 ug via INTRAVENOUS

## 2015-02-04 MED ORDER — MAGNESIUM SULFATE 4 GM/100ML IV SOLN
4.0000 g | Freq: Once | INTRAVENOUS | Status: AC
  Administered 2015-02-04: 4 g via INTRAVENOUS
  Filled 2015-02-04: qty 100

## 2015-02-04 MED ORDER — POTASSIUM CHLORIDE 10 MEQ/50ML IV SOLN
10.0000 meq | INTRAVENOUS | Status: AC
  Administered 2015-02-04 (×3): 10 meq via INTRAVENOUS

## 2015-02-04 MED ORDER — BISACODYL 10 MG RE SUPP: 10.0000 mg | Freq: Every day | RECTAL | Status: DC

## 2015-02-04 MED ORDER — ACETAMINOPHEN 650 MG RE SUPP
650.0000 mg | Freq: Once | RECTAL | Status: AC
  Administered 2015-02-04: 650 mg via RECTAL

## 2015-02-04 MED ORDER — ASPIRIN 81 MG PO CHEW: 324.0000 mg | CHEWABLE_TABLET | Freq: Every day | ORAL | Status: DC

## 2015-02-04 MED ORDER — VECURONIUM BROMIDE 10 MG IV SOLR
INTRAVENOUS | Status: DC | PRN
  Administered 2015-02-04 (×4): 5 mg via INTRAVENOUS

## 2015-02-04 MED ORDER — MILRINONE IN DEXTROSE 20 MG/100ML IV SOLN
0.2500 ug/kg/min | INTRAVENOUS | Status: DC
  Filled 2015-02-04: qty 100

## 2015-02-04 MED ORDER — VANCOMYCIN HCL IN DEXTROSE 1-5 GM/200ML-% IV SOLN
1000.0000 mg | Freq: Two times a day (BID) | INTRAVENOUS | Status: AC
Start: 2015-02-04 — End: 2015-02-05
  Administered 2015-02-04 – 2015-02-05 (×2): 1000 mg via INTRAVENOUS
  Filled 2015-02-04 (×2): qty 200

## 2015-02-04 MED ORDER — SODIUM CHLORIDE 0.9 % IJ SOLN: 3.0000 mL | INTRAMUSCULAR | Status: DC | PRN

## 2015-02-04 MED ORDER — DEXTROSE 5 % IV SOLN
0.0000 ug/min | INTRAVENOUS | Status: AC
  Administered 2015-02-04: 10 ug/min via INTRAVENOUS
  Filled 2015-02-04 (×2): qty 4

## 2015-02-04 MED ORDER — DEXMEDETOMIDINE HCL IN NACL 200 MCG/50ML IV SOLN
0.0000 ug/kg/h | INTRAVENOUS | Status: DC
  Administered 2015-02-04: 0.7 ug/kg/h via INTRAVENOUS
  Administered 2015-02-04: 0.5 ug/kg/h via INTRAVENOUS
  Filled 2015-02-04 (×2): qty 50

## 2015-02-04 MED ORDER — METOPROLOL TARTRATE 12.5 MG HALF TABLET
12.5000 mg | ORAL_TABLET | Freq: Two times a day (BID) | ORAL | Status: DC
  Administered 2015-02-06 – 2015-02-09 (×3): 12.5 mg via ORAL
  Filled 2015-02-04 (×11): qty 1

## 2015-02-04 MED ORDER — PROTAMINE SULFATE 10 MG/ML IV SOLN
INTRAVENOUS | Status: DC | PRN
  Administered 2015-02-04: 50 mg via INTRAVENOUS
  Administered 2015-02-04: 30 mg via INTRAVENOUS
  Administered 2015-02-04 (×3): 50 mg via INTRAVENOUS
  Administered 2015-02-04: 20 mg via INTRAVENOUS
  Administered 2015-02-04 (×2): 50 mg via INTRAVENOUS

## 2015-02-04 MED ORDER — SODIUM CHLORIDE 0.9 % IV SOLN
INTRAVENOUS | Status: DC | PRN
  Administered 2015-02-04 (×2): via INTRAVENOUS

## 2015-02-04 MED ORDER — HEMOSTATIC AGENTS (NO CHARGE) OPTIME
TOPICAL | Status: DC | PRN
  Administered 2015-02-04 (×3): 1 via TOPICAL

## 2015-02-04 MED ORDER — SUCCINYLCHOLINE CHLORIDE 20 MG/ML IJ SOLN
INTRAMUSCULAR | Status: AC
  Filled 2015-02-04: qty 1

## 2015-02-04 MED ORDER — ASPIRIN EC 325 MG PO TBEC
325.0000 mg | DELAYED_RELEASE_TABLET | Freq: Every day | ORAL | Status: DC
  Administered 2015-02-05: 325 mg via ORAL
  Filled 2015-02-04 (×2): qty 1

## 2015-02-04 MED ORDER — EPINEPHRINE HCL 1 MG/ML IJ SOLN
0.0000 ug/min | INTRAVENOUS | Status: DC
  Administered 2015-02-04: 2 ug/min via INTRAVENOUS
  Filled 2015-02-04 (×2): qty 4

## 2015-02-04 MED ORDER — DEXTROSE 5 % IV SOLN
0.0000 ug/min | INTRAVENOUS | Status: DC
  Filled 2015-02-04: qty 2

## 2015-02-04 MED ORDER — SODIUM BICARBONATE 8.4 % IV SOLN
INTRAVENOUS | Status: AC
  Filled 2015-02-04: qty 50

## 2015-02-04 MED ORDER — SODIUM CHLORIDE 0.9 % IJ SOLN
10.0000 mL | Freq: Two times a day (BID) | INTRAMUSCULAR | Status: DC
  Administered 2015-02-04 – 2015-02-07 (×6): 10 mL

## 2015-02-04 MED ORDER — 0.9 % SODIUM CHLORIDE (POUR BTL) OPTIME
TOPICAL | Status: DC | PRN
  Administered 2015-02-04 (×5): 1000 mL

## 2015-02-04 MED ORDER — ROCURONIUM BROMIDE 100 MG/10ML IV SOLN
INTRAVENOUS | Status: DC | PRN
  Administered 2015-02-04 (×2): 50 mg via INTRAVENOUS

## 2015-02-04 MED ORDER — METOCLOPRAMIDE HCL 5 MG/ML IJ SOLN
10.0000 mg | Freq: Four times a day (QID) | INTRAMUSCULAR | Status: AC
  Administered 2015-02-04 – 2015-02-05 (×4): 10 mg via INTRAVENOUS
  Filled 2015-02-04 (×4): qty 2

## 2015-02-04 SURGICAL SUPPLY — 135 items
ADAPTER CARDIO PERF ANTE/RETRO (ADAPTER) IMPLANT
APPLIER CLIP 9.375 MED OPEN (MISCELLANEOUS)
APPLIER CLIP 9.375 SM OPEN (CLIP)
BAG DECANTER FOR FLEXI CONT (MISCELLANEOUS) ×4 IMPLANT
BANDAGE ELASTIC 4 VELCRO ST LF (GAUZE/BANDAGES/DRESSINGS) ×4 IMPLANT
BANDAGE ELASTIC 6 VELCRO ST LF (GAUZE/BANDAGES/DRESSINGS) ×4 IMPLANT
BLADE CORE FAN STRYKER (BLADE) ×8 IMPLANT
BLADE MINI RND TIP GREEN BEAV (BLADE) ×8 IMPLANT
BLADE SAW SAG 29X58X.64 (BLADE) ×4 IMPLANT
BLADE STERNUM SYSTEM 6 (BLADE) ×4 IMPLANT
BLADE SURG 11 STRL SS (BLADE) ×4 IMPLANT
BLADE SURG 12 STRL SS (BLADE) ×4 IMPLANT
BLADE SURG 15 STRL LF DISP TIS (BLADE) ×2 IMPLANT
BLADE SURG 15 STRL SS (BLADE) ×2
BLADE SURG ROTATE 9660 (MISCELLANEOUS) ×4 IMPLANT
BNDG GAUZE ELAST 4 BULKY (GAUZE/BANDAGES/DRESSINGS) ×4 IMPLANT
CANISTER SUCTION 2500CC (MISCELLANEOUS) ×4 IMPLANT
CANNULA GUNDRY RCSP 15FR (MISCELLANEOUS) IMPLANT
CATH RETROPLEGIA CORONARY 14FR (CATHETERS) IMPLANT
CATH ROBINSON RED A/P 18FR (CATHETERS) ×12 IMPLANT
CATH THORACIC 28FR (CATHETERS) ×4 IMPLANT
CATH THORACIC 28FR RT ANG (CATHETERS) IMPLANT
CATH THORACIC 36FR (CATHETERS) ×4 IMPLANT
CATH THORACIC 36FR RT ANG (CATHETERS) ×8 IMPLANT
CLIP APPLIE 9.375 MED OPEN (MISCELLANEOUS) IMPLANT
CLIP APPLIE 9.375 SM OPEN (CLIP) IMPLANT
CLIP FOGARTY SPRING 6M (CLIP) ×4 IMPLANT
CLIP TI MEDIUM 24 (CLIP) IMPLANT
CLIP TI WIDE RED SMALL 24 (CLIP) IMPLANT
CONN ST 1/4X3/8  BEN (MISCELLANEOUS) ×2
CONN ST 1/4X3/8 BEN (MISCELLANEOUS) ×2 IMPLANT
CONN Y 3/8X3/8X3/8  BEN (MISCELLANEOUS)
CONN Y 3/8X3/8X3/8 BEN (MISCELLANEOUS) IMPLANT
COVER SURGICAL LIGHT HANDLE (MISCELLANEOUS) ×8 IMPLANT
CRADLE DONUT ADULT HEAD (MISCELLANEOUS) ×4 IMPLANT
DERMABOND ADVANCED (GAUZE/BANDAGES/DRESSINGS) ×2
DERMABOND ADVANCED .7 DNX12 (GAUZE/BANDAGES/DRESSINGS) ×2 IMPLANT
DRAPE CARDIOVASCULAR INCISE (DRAPES) ×2
DRAPE SLUSH/WARMER DISC (DRAPES) IMPLANT
DRAPE SRG 135X102X78XABS (DRAPES) ×2 IMPLANT
DRSG AQUACEL AG ADV 3.5X14 (GAUZE/BANDAGES/DRESSINGS) ×4 IMPLANT
ELECT BLADE 6.5 EXT (BLADE) ×4 IMPLANT
ELECT CAUTERY BLADE 6.4 (BLADE) ×4 IMPLANT
ELECT REM PT RETURN 9FT ADLT (ELECTROSURGICAL) ×8
ELECTRODE REM PT RTRN 9FT ADLT (ELECTROSURGICAL) ×4 IMPLANT
GAUZE SPONGE 4X4 12PLY STRL (GAUZE/BANDAGES/DRESSINGS) ×8 IMPLANT
GAUZE XEROFORM 1X8 LF (GAUZE/BANDAGES/DRESSINGS) ×4 IMPLANT
GLOVE BIO SURGEON STRL SZ 6 (GLOVE) ×16 IMPLANT
GLOVE BIO SURGEON STRL SZ 6.5 (GLOVE) ×12 IMPLANT
GLOVE BIO SURGEON STRL SZ7 (GLOVE) ×4 IMPLANT
GLOVE BIO SURGEON STRL SZ7.5 (GLOVE) ×12 IMPLANT
GLOVE BIO SURGEONS STRL SZ 6.5 (GLOVE) ×4
GLOVE BIOGEL PI IND STRL 6 (GLOVE) ×2 IMPLANT
GLOVE BIOGEL PI IND STRL 6.5 (GLOVE) ×2 IMPLANT
GLOVE BIOGEL PI IND STRL 7.0 (GLOVE) ×2 IMPLANT
GLOVE BIOGEL PI INDICATOR 6 (GLOVE) ×2
GLOVE BIOGEL PI INDICATOR 6.5 (GLOVE) ×2
GLOVE BIOGEL PI INDICATOR 7.0 (GLOVE) ×2
GLOVE EUDERMIC 7 POWDERFREE (GLOVE) ×4 IMPLANT
GOWN STRL REUS W/ TWL LRG LVL3 (GOWN DISPOSABLE) ×8 IMPLANT
GOWN STRL REUS W/TWL LRG LVL3 (GOWN DISPOSABLE) ×8
HEMOSTAT POWDER SURGIFOAM 1G (HEMOSTASIS) ×12 IMPLANT
HEMOSTAT SURGICEL 2X14 (HEMOSTASIS) IMPLANT
INSERT FOGARTY 61MM (MISCELLANEOUS) ×4 IMPLANT
INSERT FOGARTY XLG (MISCELLANEOUS) IMPLANT
INTRODUCER AVANTI 5FR (MISCELLANEOUS) ×4 IMPLANT
KIT BASIN OR (CUSTOM PROCEDURE TRAY) ×4 IMPLANT
KIT ROOM TURNOVER OR (KITS) ×4 IMPLANT
KIT SUCTION CATH 14FR (SUCTIONS) ×4 IMPLANT
KIT VASOVIEW W/TROCAR VH 2000 (KITS) ×4 IMPLANT
LOOP VESSEL MINI RED (MISCELLANEOUS) ×4 IMPLANT
MARKER GRAFT CORONARY BYPASS (MISCELLANEOUS) IMPLANT
NS IRRIG 1000ML POUR BTL (IV SOLUTION) ×24 IMPLANT
PACK OPEN HEART (CUSTOM PROCEDURE TRAY) ×4 IMPLANT
PAD ARMBOARD 7.5X6 YLW CONV (MISCELLANEOUS) ×8 IMPLANT
PAD DEFIB R2 (MISCELLANEOUS) IMPLANT
PAD ELECT DEFIB RADIOL ZOLL (MISCELLANEOUS) ×4 IMPLANT
PENCIL BUTTON HOLSTER BLD 10FT (ELECTRODE) ×8 IMPLANT
PUNCH AORTIC ROTATE 4.0MM (MISCELLANEOUS) ×4 IMPLANT
PUNCH AORTIC ROTATE 4.5MM 8IN (MISCELLANEOUS) IMPLANT
PUNCH AORTIC ROTATE 5MM 8IN (MISCELLANEOUS) IMPLANT
SENSOR MYOCARDIAL TEMP (MISCELLANEOUS) ×4 IMPLANT
SOLUTION ANTI FOG 6CC (MISCELLANEOUS) ×8 IMPLANT
SPONGE GAUZE 4X4 12PLY STER LF (GAUZE/BANDAGES/DRESSINGS) ×8 IMPLANT
SPONGE INTESTINAL PEANUT (DISPOSABLE) ×4 IMPLANT
SPONGE LAP 18X18 X RAY DECT (DISPOSABLE) ×12 IMPLANT
SPONGE LAP 4X18 X RAY DECT (DISPOSABLE) ×4 IMPLANT
STOPCOCK 4 WAY LG BORE MALE ST (IV SETS) ×4 IMPLANT
SUT BONE WAX W31G (SUTURE) ×4 IMPLANT
SUT ETHIBOND 2 0 SH (SUTURE) ×4
SUT ETHIBOND 2 0 SH 36X2 (SUTURE) ×4 IMPLANT
SUT MNCRL AB 4-0 PS2 18 (SUTURE) ×8 IMPLANT
SUT PROLENE 3 0 SH 1 (SUTURE) ×4 IMPLANT
SUT PROLENE 3 0 SH DA (SUTURE) ×4 IMPLANT
SUT PROLENE 3 0 SH1 36 (SUTURE) ×4 IMPLANT
SUT PROLENE 4 0 RB 1 (SUTURE) ×4
SUT PROLENE 4 0 SH DA (SUTURE) ×8 IMPLANT
SUT PROLENE 4-0 RB1 .5 CRCL 36 (SUTURE) ×4 IMPLANT
SUT PROLENE 5 0 C 1 36 (SUTURE) ×4 IMPLANT
SUT PROLENE 6 0 C 1 30 (SUTURE) ×28 IMPLANT
SUT PROLENE 6 0 CC (SUTURE) ×16 IMPLANT
SUT PROLENE 7 0 BV 1 (SUTURE) ×4 IMPLANT
SUT PROLENE 7 0 BV1 MDA (SUTURE) ×4 IMPLANT
SUT PROLENE 7 0 DA (SUTURE) IMPLANT
SUT PROLENE 8 0 BV175 6 (SUTURE) IMPLANT
SUT PROLENE BLUE 7 0 (SUTURE) ×16 IMPLANT
SUT PROLENE POLY MONO (SUTURE) ×8 IMPLANT
SUT SILK  1 MH (SUTURE) ×2
SUT SILK 1 MH (SUTURE) ×2 IMPLANT
SUT SILK 2 0 SH CR/8 (SUTURE) ×8 IMPLANT
SUT SILK 3 0 SH CR/8 (SUTURE) ×4 IMPLANT
SUT STEEL 6MS V (SUTURE) ×8 IMPLANT
SUT STEEL STERNAL CCS#1 18IN (SUTURE) IMPLANT
SUT STEEL SZ 6 DBL 3X14 BALL (SUTURE) ×4 IMPLANT
SUT VIC AB 1 CTX 36 (SUTURE)
SUT VIC AB 1 CTX36XBRD ANBCTR (SUTURE) IMPLANT
SUT VIC AB 2-0 CT1 27 (SUTURE) ×2
SUT VIC AB 2-0 CT1 TAPERPNT 27 (SUTURE) ×2 IMPLANT
SUT VIC AB 2-0 CTX 27 (SUTURE) IMPLANT
SUT VIC AB 3-0 SH 27 (SUTURE) ×2
SUT VIC AB 3-0 SH 27X BRD (SUTURE) ×2 IMPLANT
SUT VIC AB 3-0 X1 27 (SUTURE) IMPLANT
SUTURE E-PAK OPEN HEART (SUTURE) ×4 IMPLANT
SYSTEM SAHARA CHEST DRAIN ATS (WOUND CARE) ×8 IMPLANT
TAPE CLOTH SURG 4X10 WHT LF (GAUZE/BANDAGES/DRESSINGS) ×4 IMPLANT
TAPE PAPER 2X10 WHT MICROPORE (GAUZE/BANDAGES/DRESSINGS) ×4 IMPLANT
TOWEL OR 17X24 6PK STRL BLUE (TOWEL DISPOSABLE) ×4 IMPLANT
TOWEL OR 17X26 10 PK STRL BLUE (TOWEL DISPOSABLE) ×4 IMPLANT
TRAY CATH LUMEN 1 20CM STRL (SET/KITS/TRAYS/PACK) ×4 IMPLANT
TRAY FOLEY IC TEMP SENS 16FR (CATHETERS) ×4 IMPLANT
TUBING ART PRESS 48 MALE/FEM (TUBING) ×8 IMPLANT
TUBING INSUFFLATION (TUBING) ×4 IMPLANT
UNDERPAD 30X30 INCONTINENT (UNDERPADS AND DIAPERS) ×4 IMPLANT
WATER STERILE IRR 1000ML POUR (IV SOLUTION) ×8 IMPLANT
YANKAUER SUCT BULB TIP NO VENT (SUCTIONS) ×4 IMPLANT

## 2015-02-04 NOTE — Progress Notes (Signed)
  Echocardiogram Echocardiogram Transesophageal has been performed.  Donata Clay 02/04/2015, 8:42 AM

## 2015-02-04 NOTE — Progress Notes (Deleted)
  Echocardiogram 2D Echocardiogram has been performed.  [name] 02/04/2015, 8:40 AM

## 2015-02-04 NOTE — Progress Notes (Signed)
CT surgery p.m. Rounds  Hemodynamically stable status post redo CABG Minimal chest tube output Starting to wean sedation for ventilator wean Continue current protocol for extubation. Continue low-dose milrinone and titrate norepinephrine to maintain systolic blood pressure greater than 100

## 2015-02-04 NOTE — OR Nursing (Signed)
Calls made to unit.  1st- 1340.  2nd 1350.  3rd- 1435, Charge RN made aware patient has a left femoral Aline in place.

## 2015-02-04 NOTE — Plan of Care (Signed)
Problem: Phase I - Pre-Op Goal: Point person for discharge identified Outcome: Completed/Met Date Met:  02/04/15 Wife Silva Bandy

## 2015-02-04 NOTE — Transfer of Care (Signed)
Immediate Anesthesia Transfer of Care Note  Patient: [name]  Procedure(s) Performed: Procedure(s): REDO CORONARY ARTERY BYPASS GRAFTING x two, using left leg greater saphenous vein harvested endoscopically.  (N/A) TRANSESOPHAGEAL ECHOCARDIOGRAM (TEE) (N/A)  Patient Location: ICU  Anesthesia Type:General  Level of Consciousness: sedated  Airway & Oxygen Therapy: Patient remains intubated per anesthesia plan and Patient placed on Ventilator (see vital sign flow sheet for setting)  Post-op Assessment: Report given to RN  Post vital signs: Reviewed and stable  Last Vitals:  Filed Vitals:   02/04/15 1538  BP: 120/64  Pulse: 96  Temp:   Resp: 12    Complications: No apparent anesthesia complications

## 2015-02-04 NOTE — Anesthesia Preprocedure Evaluation (Addendum)
Anesthesia Evaluation  Patient identified by MRN, date of birth, ID band Patient awake    Reviewed: Allergy & Precautions, NPO status , Patient's Chart, lab work & pertinent test results  Airway Mallampati: II   Neck ROM: full    Dental   Pulmonary Current Smoker,  breath sounds clear to auscultation        Cardiovascular + CAD, + Past MI and + CABG Rhythm:regular Rate:Normal     Neuro/Psych    GI/Hepatic   Endo/Other    Renal/GU      Musculoskeletal   Abdominal   Peds  Hematology   Anesthesia Other Findings   Reproductive/Obstetrics                            Anesthesia Physical Anesthesia Plan  ASA: III  Anesthesia Plan: General   Post-op Pain Management:    Induction: Intravenous  Airway Management Planned: Oral ETT  Additional Equipment: Arterial line, CVP, PA Cath, TEE and Ultrasound Guidance Line Placement  Intra-op Plan:   Post-operative Plan: Post-operative intubation/ventilation  Informed Consent: I have reviewed the patients History and Physical, chart, labs and discussed the procedure including the risks, benefits and alternatives for the proposed anesthesia with the patient or authorized representative who has indicated his/her understanding and acceptance.     Plan Discussed with: CRNA, Anesthesiologist and Surgeon  Anesthesia Plan Comments:        Anesthesia Quick Evaluation

## 2015-02-04 NOTE — Anesthesia Postprocedure Evaluation (Signed)
  Anesthesia Post-op Note  Patient: [name]  Procedure(s) Performed: Procedure(s): REDO CORONARY ARTERY BYPASS GRAFTING x two, using left leg greater saphenous vein harvested endoscopically.  (N/A) TRANSESOPHAGEAL ECHOCARDIOGRAM (TEE) (N/A)  Patient Location: SICU  Anesthesia Type:General  Level of Consciousness: sedated and Patient remains intubated per anesthesia plan  Airway and Oxygen Therapy: Patient remains intubated per anesthesia plan and Patient placed on Ventilator (see vital sign flow sheet for setting)  Post-op Pain: none  Post-op Assessment: Post-op Vital signs reviewed and stable              Post-op Vital Signs: Reviewed  Last Vitals:  Filed Vitals:   02/04/15 2115  BP:   Pulse: 92  Temp: 36.5 C  Resp: 15    Complications: No apparent anesthesia complications

## 2015-02-04 NOTE — Brief Op Note (Signed)
01/28/2015 - 02/04/2015  12:38 PM  PATIENT:  Tracie Harrier  59 y.o. male  PRE-OPERATIVE DIAGNOSIS:  Myocardial Infarction, recurrent coronary artery disease  POST-OPERATIVE DIAGNOSIS:  Myocardial Infarction, recurrent coronary artery disease  PROCEDURE:  Procedure(s):  REDO CORONARY ARTERY BYPASS GRAFTING x 2 -SVG to OM -SVG to PDA  ENDOSCOPIC HARVEST GREATER SAPHENOUS VEIN  -Left Thigh to Below the Knee  TRANSESOPHAGEAL ECHOCARDIOGRAM (TEE) (N/A)  SURGEON:  Surgeon(s) and Role:    * Ivin Poot, MD - Primary  PHYSICIAN ASSISTANT: Ellwood Handler PA-C  ANESTHESIA:   none  EBL:  Total I/O In: 2000 [I.V.:2000] Out: 350 [Urine:350]  BLOOD ADMINISTERED:  CELLSAVER, 2 FFP and 2 PLTS  DRAINS: Mediastinal Chest Tubes, Bilateral Pleural Chest Tubes   LOCAL MEDICATIONS USED:  NONE  SPECIMEN:  No Specimen  DISPOSITION OF SPECIMEN:  N/A  COUNTS:  YES  TOURNIQUET:  * No tourniquets in log *  DICTATION: .Dragon Dictation  PLAN OF CARE: Admit to inpatient   PATIENT DISPOSITION:  ICU - intubated and hemodynamically stable.   Delay start of Pharmacological VTE agent (>24hrs) due to surgical blood loss or risk of bleeding: yes

## 2015-02-04 NOTE — Progress Notes (Signed)
The patient was examined and preop studies reviewed. There has been no change from the prior exam and the patient is ready for surgery.   Plan redo CABG on [name]

## 2015-02-04 NOTE — Procedures (Signed)
Extubation Procedure Note  Patient Details:   Name: [name] DOB: November 02, 1955 MRN: [id number]     Evaluation  O2 sats: stable throughout Complications: No apparent complications Patient did tolerate procedure well. Bilateral Breath Sounds: Clear Suctioning: Airway  Pt ability to speak: Yes  NIF -30, FVC 0.8L, Pt able to breath around cuff. ABG WNL. Pt extubated to 4L N/C with no complications. No stridor. Pt oriented to self.  [name] 02/04/2015, 11:15 PM

## 2015-02-04 NOTE — Anesthesia Procedure Notes (Signed)
Procedure Name: Intubation Date/Time: 02/04/2015 7:42 AM Performed by: [place] Pink Pre-anesthesia Checklist: Patient identified, Emergency Drugs available, Suction available, Patient being monitored and Timeout performed Patient Re-evaluated:Patient Re-evaluated prior to inductionOxygen Delivery Method: Circle system utilized Preoxygenation: Pre-oxygenation with 100% oxygen Intubation Type: IV induction Ventilation: Mask ventilation without difficulty and Oral airway inserted - appropriate to patient size Laryngoscope Size: Mac and 3 Grade View: Grade III Tube type: Oral Tube size: 8.0 mm Number of attempts: 1 Airway Equipment and Method: Stylet Placement Confirmation: ETT inserted through vocal cords under direct vision,  positive ETCO2 and breath sounds checked- equal and bilateral Secured at: 22 cm Tube secured with: Tape Dental Injury: Teeth and Oropharynx as per pre-operative assessment

## 2015-02-05 ENCOUNTER — Inpatient Hospital Stay (HOSPITAL_COMMUNITY): Payer: Medicare Other

## 2015-02-05 DIAGNOSIS — I209 Angina pectoris, unspecified: Secondary | ICD-10-CM

## 2015-02-05 DIAGNOSIS — Z951 Presence of aortocoronary bypass graft: Secondary | ICD-10-CM

## 2015-02-05 LAB — GLUCOSE, CAPILLARY
GLUCOSE-CAPILLARY: 102 mg/dL — AB (ref 65–99)
GLUCOSE-CAPILLARY: 103 mg/dL — AB (ref 65–99)
GLUCOSE-CAPILLARY: 104 mg/dL — AB (ref 65–99)
GLUCOSE-CAPILLARY: 105 mg/dL — AB (ref 65–99)
GLUCOSE-CAPILLARY: 117 mg/dL — AB (ref 65–99)
GLUCOSE-CAPILLARY: 75 mg/dL (ref 65–99)
Glucose-Capillary: 105 mg/dL — ABNORMAL HIGH (ref 65–99)
Glucose-Capillary: 113 mg/dL — ABNORMAL HIGH (ref 65–99)
Glucose-Capillary: 148 mg/dL — ABNORMAL HIGH (ref 65–99)
Glucose-Capillary: 173 mg/dL — ABNORMAL HIGH (ref 65–99)
Glucose-Capillary: 216 mg/dL — ABNORMAL HIGH (ref 65–99)
Glucose-Capillary: 80 mg/dL (ref 65–99)

## 2015-02-05 LAB — CBC
HCT: 26.1 % — ABNORMAL LOW (ref 39.0–52.0)
HCT: 26.9 % — ABNORMAL LOW (ref 39.0–52.0)
HCT: 26.6 % — ABNORMAL LOW (ref 39.0–52.0)
HEMOGLOBIN: 9.3 g/dL — AB (ref 13.0–17.0)
Hemoglobin: 9.4 g/dL — ABNORMAL LOW (ref 13.0–17.0)
Hemoglobin: 9.4 g/dL — ABNORMAL LOW (ref 13.0–17.0)
MCH: 30.5 pg (ref 26.0–34.0)
MCH: 30.3 pg (ref 26.0–34.0)
MCH: 30.5 pg (ref 26.0–34.0)
MCHC: 35.6 g/dL (ref 30.0–36.0)
MCHC: 34.9 g/dL (ref 30.0–36.0)
MCHC: 35.3 g/dL (ref 30.0–36.0)
MCV: 85.6 fL (ref 78.0–100.0)
MCV: 86.8 fL (ref 78.0–100.0)
MCV: 86.4 fL (ref 78.0–100.0)
PLATELETS: 122 10*3/uL — AB (ref 150–400)
PLATELETS: 94 10*3/uL — AB (ref 150–400)
Platelets: 90 10*3/uL — ABNORMAL LOW (ref 150–400)
RBC: 3.05 MIL/uL — AB (ref 4.22–5.81)
RBC: 3.1 MIL/uL — ABNORMAL LOW (ref 4.22–5.81)
RBC: 3.08 MIL/uL — ABNORMAL LOW (ref 4.22–5.81)
RDW: 13.6 % (ref 11.5–15.5)
RDW: 13.7 % (ref 11.5–15.5)
RDW: 13.9 % (ref 11.5–15.5)
WBC: 14.7 10*3/uL — AB (ref 4.0–10.5)
WBC: 14.1 10*3/uL — ABNORMAL HIGH (ref 4.0–10.5)
WBC: 13.4 10*3/uL — ABNORMAL HIGH (ref 4.0–10.5)

## 2015-02-05 LAB — POCT I-STAT, CHEM 8
BUN: 16 mg/dL (ref 6–20)
CALCIUM ION: 1.02 mmol/L — AB (ref 1.12–1.23)
CHLORIDE: 102 mmol/L (ref 101–111)
CREATININE: 1.3 mg/dL — AB (ref 0.61–1.24)
Glucose, Bld: 87 mg/dL (ref 65–99)
HCT: 26 % — ABNORMAL LOW (ref 39.0–52.0)
Hemoglobin: 8.8 g/dL — ABNORMAL LOW (ref 13.0–17.0)
Potassium: 4.2 mmol/L (ref 3.5–5.1)
Sodium: 135 mmol/L (ref 135–145)
TCO2: 22 mmol/L (ref 0–100)

## 2015-02-05 LAB — POCT I-STAT 3, ART BLOOD GAS (G3+)
Acid-base deficit: 1 mmol/L (ref 0.0–2.0)
Bicarbonate: 24.8 mEq/L — ABNORMAL HIGH (ref 20.0–24.0)
O2 Saturation: 95 %
PCO2 ART: 42.6 mmHg (ref 35.0–45.0)
PH ART: 7.372 (ref 7.350–7.450)
Patient temperature: 37
TCO2: 26 mmol/L (ref 0–100)
pO2, Arterial: 76 mmHg — ABNORMAL LOW (ref 80.0–100.0)

## 2015-02-05 LAB — PREPARE FRESH FROZEN PLASMA
UNIT DIVISION: 0
UNIT DIVISION: 0

## 2015-02-05 LAB — BASIC METABOLIC PANEL
Anion gap: 5 (ref 5–15)
BUN: 15 mg/dL (ref 6–20)
CHLORIDE: 105 mmol/L (ref 101–111)
CO2: 27 mmol/L (ref 22–32)
Calcium: 8 mg/dL — ABNORMAL LOW (ref 8.9–10.3)
Creatinine, Ser: 1.21 mg/dL (ref 0.61–1.24)
GFR calc non Af Amer: >60 mL/min (ref >60)
Glucose, Bld: 101 mg/dL — ABNORMAL HIGH (ref 65–99)
POTASSIUM: 4.7 mmol/L (ref 3.5–5.1)
SODIUM: 137 mmol/L (ref 135–145)

## 2015-02-05 LAB — PREPARE PLATELET PHERESIS: Unit division: 0

## 2015-02-05 LAB — CREATININE, SERUM
Creatinine, Ser: 1.37 mg/dL — ABNORMAL HIGH (ref 0.61–1.24)
GFR calc Af Amer: >60 mL/min (ref >60)
GFR calc non Af Amer: 55 mL/min — ABNORMAL LOW (ref >60)

## 2015-02-05 LAB — MAGNESIUM
MAGNESIUM: 2.3 mg/dL (ref 1.7–2.4)
Magnesium: 2.2 mg/dL (ref 1.7–2.4)

## 2015-02-05 MED ORDER — INSULIN GLARGINE 100 UNIT/ML ~~LOC~~ SOLN
12.0000 [IU] | Freq: Two times a day (BID) | SUBCUTANEOUS | Status: AC
  Administered 2015-02-05: 12 [IU] via SUBCUTANEOUS
  Filled 2015-02-05: qty 0.12

## 2015-02-05 MED ORDER — INSULIN ASPART 100 UNIT/ML ~~LOC~~ SOLN
0.0000 [IU] | SUBCUTANEOUS | Status: DC
  Administered 2015-02-06 (×2): 2 [IU] via SUBCUTANEOUS

## 2015-02-05 MED ORDER — FUROSEMIDE 10 MG/ML IJ SOLN
4.0000 mg/h | INTRAVENOUS | Status: DC
  Administered 2015-02-05: 4 mg/h via INTRAVENOUS
  Filled 2015-02-05: qty 25

## 2015-02-05 MED ORDER — CETYLPYRIDINIUM CHLORIDE 0.05 % MT LIQD
7.0000 mL | Freq: Two times a day (BID) | OROMUCOSAL | Status: DC
  Administered 2015-02-05 – 2015-02-06 (×4): 7 mL via OROMUCOSAL

## 2015-02-05 MED ORDER — CHLORHEXIDINE GLUCONATE 0.12 % MT SOLN
15.0000 mL | Freq: Two times a day (BID) | OROMUCOSAL | Status: DC
  Administered 2015-02-05 (×2): 15 mL via OROMUCOSAL
  Filled 2015-02-05 (×3): qty 15

## 2015-02-05 MED FILL — Mannitol IV Soln 20%: INTRAVENOUS | Qty: 500 | Status: AC

## 2015-02-05 MED FILL — Electrolyte-R (PH 7.4) Solution: INTRAVENOUS | Qty: 6000 | Status: AC

## 2015-02-05 MED FILL — Heparin Sodium (Porcine) Inj 1000 Unit/ML: INTRAMUSCULAR | Qty: 20 | Status: AC

## 2015-02-05 MED FILL — Potassium Chloride Inj 2 mEq/ML: INTRAVENOUS | Qty: 40 | Status: AC

## 2015-02-05 MED FILL — Magnesium Sulfate Inj 50%: INTRAMUSCULAR | Qty: 10 | Status: AC

## 2015-02-05 MED FILL — Lidocaine HCl IV Inj 20 MG/ML: INTRAVENOUS | Qty: 5 | Status: AC

## 2015-02-05 MED FILL — Sodium Bicarbonate IV Soln 8.4%: INTRAVENOUS | Qty: 50 | Status: AC

## 2015-02-05 MED FILL — Sodium Chloride IV Soln 0.9%: INTRAVENOUS | Qty: 2000 | Status: AC

## 2015-02-05 MED FILL — Heparin Sodium (Porcine) Inj 1000 Unit/ML: INTRAMUSCULAR | Qty: 30 | Status: AC

## 2015-02-05 NOTE — Progress Notes (Signed)
1 Day Post-Op Procedure(s) (LRB): REDO CORONARY ARTERY BYPASS GRAFTING x two, using left leg greater saphenous vein harvested endoscopically.  (N/A) TRANSESOPHAGEAL ECHOCARDIOGRAM (TEE) (N/A) Subjective: Patient extubated, neuro intact Hemodynamics stable-normal sinus rhythm, weaning inotropes-epinephrine off, we'll remove PA catheter after norepinephrine is at a lower dose  Chest tubes with serosanguineous drainage-will leave intact  Chest x-ray shows mild congestion-weight up over 10 pounds-we'll start IV Lasix drip  Postop EKG shows no new changes  Objective: Vital signs in last 24 hours: Temp:  [95.2 F (35.1 C)-99.1 F (37.3 C)] 99.1 F (37.3 C) (06/14 1200) Pulse Rate:  [73-109] 79 (06/14 1200) Cardiac Rhythm:  [-] Normal sinus rhythm (06/14 1200) Resp:  [11-35] 25 (06/14 1200) BP: (90-120)/(55-71) 104/55 mmHg (06/14 1200) SpO2:  [90 %-99 %] 92 % (06/14 1200) Arterial Line BP: (91-127)/(50-73) 111/66 mmHg (06/14 0015) FiO2 (%):  [40 %-50 %] 40 % (06/13 2212) Weight:  [208 lb 12.8 oz (94.711 kg)-233 lb 11 oz (106 kg)] 233 lb 11 oz (106 kg) (06/14 0500)  Hemodynamic parameters for last 24 hours: PAP: (20-49)/(8-21) 22/12 mmHg CO:  [5 L/min-5.7 L/min] 5.6 L/min CI:  [2.2 L/min/m2-2.6 L/min/m2] 2.6 L/min/m2  Intake/Output from previous day: 06/13 0701 - 06/14 0700 In: 11193.5 [I.V.:7752.5; Blood:1711; NG/GT:30; IV Piggyback:1700] Out: 3419 [Urine:2955; Emesis/NG output:150; Blood:1200; Chest Tube:1375] Intake/Output this shift: Total I/O In: 192.5 [I.V.:192.5] Out: 1050 [Urine:1050]  Alert neuro intact Breath sounds coarse Abdomen soft Extremities warm No airleak through chest tubes  Lab Results:  Recent Labs  02/05/15 0445 02/05/15 1240  WBC 14.7* 14.1*  HGB 9.3* 9.4*  HCT 26.1* 26.9*  PLT 122* PENDING   BMET:  Recent Labs  02/03/15 2355  02/04/15 2138 02/04/15 2140 02/05/15 0445  NA 137  < > 136  --  137  K 4.9  < > 3.6  --  4.7  CL 102  < >  100*  --  105  CO2 29  --   --   --  27  GLUCOSE 112*  < > 223*  --  101*  BUN 20  < > 16  --  15  CREATININE 1.49*  < > 1.20 1.46* 1.21  CALCIUM 9.1  --   --   --  8.0*  < > = values in this interval not displayed.  PT/INR:  Recent Labs  02/04/15 1608  LABPROT 17.9*  INR 1.47   ABG    Component Value Date/Time   PHART 7.372 02/05/2015 0447   HCO3 24.8* 02/05/2015 0447   TCO2 26 02/05/2015 0447   ACIDBASEDEF 1.0 02/05/2015 0447   O2SAT 95.0 02/05/2015 0447   CBG (last 3)   Recent Labs  02/05/15 0443 02/05/15 0542 02/05/15 0642  GLUCAP 105* 104* 103*    Assessment/Plan: S/P Procedure(s) (LRB): REDO CORONARY ARTERY BYPASS GRAFTING x two, using left leg greater saphenous vein harvested endoscopically.  (N/A) TRANSESOPHAGEAL ECHOCARDIOGRAM (TEE) (N/A) Recheck CBC at noon Progression orders placed Start IV Lasix infusion for gentle diuresis   LOS: 8 days    [name] 02/05/2015

## 2015-02-05 NOTE — Progress Notes (Signed)
Subjective:  No CP/SOB. POD #1 re do CABG X2. Looks good.  Objective:  Temp:  [95.2 F (35.1 C)-99.1 F (37.3 C)] 99.1 F (37.3 C) (06/14 1200) Pulse Rate:  [73-109] 79 (06/14 1200) Resp:  [11-35] 25 (06/14 1200) BP: (90-120)/(55-71) 104/55 mmHg (06/14 1200) SpO2:  [90 %-99 %] 92 % (06/14 1200) Arterial Line BP: (91-127)/(50-73) 111/66 mmHg (06/14 0015) FiO2 (%):  [40 %-50 %] 40 % (06/13 2212) Weight:  [208 lb 12.8 oz (94.711 kg)-233 lb 11 oz (106 kg)] 233 lb 11 oz (106 kg) (06/14 0500) Weight change: 0 lb (0 kg)  Intake/Output from previous day: 06/13 0701 - 06/14 0700 In: 11193.5 [I.V.:7752.5; Blood:1711; NG/GT:30; IV Piggyback:1700] Out: 1478 [Urine:2955; Emesis/NG output:150; Blood:1200; Chest Tube:1375]  Intake/Output from this shift: Total I/O In: 192.5 [I.V.:192.5] Out: 1050 [Urine:1050]  Physical Exam: General appearance: alert and no distress Neck: no adenopathy, no carotid bruit, no JVD, supple, symmetrical, trachea midline and thyroid not enlarged, symmetric, no tenderness/mass/nodules Lungs: clear to auscultation bilaterally Heart: RRR, + FR Extremities: extremities normal, atraumatic, no cyanosis or edema  Lab Results: Results for orders placed or performed during the hospital encounter of 01/28/15 (from the past 48 hour(s))  Type and screen     Status: None (Preliminary result)   Collection Time: 02/03/15  7:49 PM  Result Value Ref Range   ABO/RH(D) A POS    Antibody Screen NEG    Sample Expiration 02/06/2015    Unit Number [id number]    Blood Component Type RED CELLS,LR    Unit division 00    Status of Unit ALLOCATED    Transfusion Status OK TO TRANSFUSE    Crossmatch Result Compatible    Unit Number [id number]    Blood Component Type RED CELLS,LR    Unit division 00    Status of Unit ISSUED,FINAL    Transfusion Status OK TO TRANSFUSE    Crossmatch Result Compatible    Unit Number [id number]    Blood Component Type RED CELLS,LR      Unit division 00    Status of Unit ALLOCATED    Transfusion Status OK TO TRANSFUSE    Crossmatch Result Compatible    Unit Number [id number]    Blood Component Type RED CELLS,LR    Unit division 00    Status of Unit ISSUED,FINAL    Transfusion Status OK TO TRANSFUSE    Crossmatch Result Compatible   Prepare RBC (crossmatch)     Status: None   Collection Time: 02/03/15  7:49 PM  Result Value Ref Range   Order Confirmation ORDER PROCESSED BY BLOOD BANK   ABO/Rh     Status: None   Collection Time: 02/03/15  7:49 PM  Result Value Ref Range   ABO/RH(D) A POS   CBC     Status: Abnormal   Collection Time: 02/03/15 11:55 PM  Result Value Ref Range   WBC 6.5 4.0 - 10.5 K/uL   RBC 4.34 4.22 - 5.81 MIL/uL   Hemoglobin 13.0 13.0 - 17.0 g/dL   HCT 38.1 (L) 39.0 - 52.0 %   MCV 87.8 78.0 - 100.0 fL   MCH 30.0 26.0 - 34.0 pg   MCHC 34.1 30.0 - 36.0 g/dL   RDW 13.7 11.5 - 15.5 %   Platelets 165 150 - 400 K/uL  Basic metabolic panel     Status: Abnormal   Collection Time: 02/03/15 11:55 PM  Result Value Ref Range   Sodium 137 135 - 145  mmol/L   Potassium 4.9 3.5 - 5.1 mmol/L   Chloride 102 101 - 111 mmol/L   CO2 29 22 - 32 mmol/L   Glucose, Bld 112 (H) 65 - 99 mg/dL   BUN 20 6 - 20 mg/dL   Creatinine, Ser 1.49 (H) 0.61 - 1.24 mg/dL   Calcium 9.1 8.9 - 10.3 mg/dL   GFR calc non Af Amer 50 (L) >60 mL/min   GFR calc Af Amer 58 (L) >60 mL/min    Comment: (NOTE) The eGFR has been calculated using the CKD EPI equation. This calculation has not been validated in all clinical situations. eGFR's persistently <60 mL/min signify possible Chronic Kidney Disease.    Anion gap 6 5 - 15  CBC     Status: Abnormal   Collection Time: 02/04/15  4:05 AM  Result Value Ref Range   WBC 7.3 4.0 - 10.5 K/uL   RBC 4.33 4.22 - 5.81 MIL/uL   Hemoglobin 13.2 13.0 - 17.0 g/dL   HCT 37.7 (L) 39.0 - 52.0 %   MCV 87.1 78.0 - 100.0 fL   MCH 30.5 26.0 - 34.0 pg   MCHC 35.0 30.0 - 36.0 g/dL   RDW 13.5  11.5 - 15.5 %   Platelets 161 150 - 400 K/uL  Heparin level (unfractionated)     Status: Abnormal   Collection Time: 02/04/15  4:05 AM  Result Value Ref Range   Heparin Unfractionated 0.72 (H) 0.30 - 0.70 IU/mL    Comment:        IF HEPARIN RESULTS ARE BELOW EXPECTED VALUES, AND PATIENT DOSAGE HAS BEEN CONFIRMED, SUGGEST FOLLOW UP TESTING OF ANTITHROMBIN III LEVELS.   I-STAT, chem 8     Status: Abnormal   Collection Time: 02/04/15  7:50 AM  Result Value Ref Range   Sodium 137 135 - 145 mmol/L   Potassium 4.5 3.5 - 5.1 mmol/L   Chloride 103 101 - 111 mmol/L   BUN 20 6 - 20 mg/dL   Creatinine, Ser 1.20 0.61 - 1.24 mg/dL   Glucose, Bld 97 65 - 99 mg/dL   Calcium, Ion 1.28 (H) 1.12 - 1.23 mmol/L   TCO2 23 0 - 100 mmol/L   Hemoglobin 11.9 (L) 13.0 - 17.0 g/dL   HCT 35.0 (L) 39.0 - 52.0 %  I-STAT, chem 8     Status: Abnormal   Collection Time: 02/04/15 10:18 AM  Result Value Ref Range   Sodium 136 135 - 145 mmol/L   Potassium 4.5 3.5 - 5.1 mmol/L   Chloride 102 101 - 111 mmol/L   BUN 18 6 - 20 mg/dL   Creatinine, Ser 1.10 0.61 - 1.24 mg/dL   Glucose, Bld 118 (H) 65 - 99 mg/dL   Calcium, Ion 1.22 1.12 - 1.23 mmol/L   TCO2 22 0 - 100 mmol/L   Hemoglobin 11.2 (L) 13.0 - 17.0 g/dL   HCT 33.0 (L) 39.0 - 52.0 %  I-STAT, chem 8     Status: Abnormal   Collection Time: 02/04/15 10:54 AM  Result Value Ref Range   Sodium 130 (L) 135 - 145 mmol/L   Potassium 4.6 3.5 - 5.1 mmol/L   Chloride 94 (L) 101 - 111 mmol/L   BUN 16 6 - 20 mg/dL   Creatinine, Ser 0.90 0.61 - 1.24 mg/dL   Glucose, Bld 110 (H) 65 - 99 mg/dL   Calcium, Ion 1.01 (L) 1.12 - 1.23 mmol/L   TCO2 23 0 - 100 mmol/L   Hemoglobin 8.2 (  L) 13.0 - 17.0 g/dL   HCT 24.0 (L) 39.0 - 52.0 %  I-STAT 3, arterial blood gas (G3+)     Status: Abnormal   Collection Time: 02/04/15 10:58 AM  Result Value Ref Range   pH, Arterial 7.453 (H) 7.350 - 7.450   pCO2 arterial 34.0 (L) 35.0 - 45.0 mmHg   pO2, Arterial 388.0 (H) 80.0 - 100.0  mmHg   Bicarbonate 23.8 20.0 - 24.0 mEq/L   TCO2 25 0 - 100 mmol/L   O2 Saturation 100.0 %   Sample type ARTERIAL   I-STAT, chem 8     Status: Abnormal   Collection Time: 02/04/15 11:56 AM  Result Value Ref Range   Sodium 131 (L) 135 - 145 mmol/L   Potassium 6.0 (H) 3.5 - 5.1 mmol/L   Chloride 100 (L) 101 - 111 mmol/L   BUN 19 6 - 20 mg/dL   Creatinine, Ser 1.00 0.61 - 1.24 mg/dL   Glucose, Bld 136 (H) 65 - 99 mg/dL   Calcium, Ion 1.09 (L) 1.12 - 1.23 mmol/L   TCO2 23 0 - 100 mmol/L   Hemoglobin 8.8 (L) 13.0 - 17.0 g/dL   HCT 26.0 (L) 39.0 - 52.0 %  Prepare Pheresed Platelets     Status: None   Collection Time: 02/04/15 12:19 PM  Result Value Ref Range   Unit Number [id number]    Blood Component Type PLTP LR2 PAS    Unit division 00    Status of Unit ISSUED,FINAL    Transfusion Status OK TO TRANSFUSE   Prepare fresh frozen plasma     Status: None   Collection Time: 02/04/15 12:19 PM  Result Value Ref Range   Unit Number [id number]    Blood Component Type THAWED PLASMA    Unit division 00    Status of Unit ISSUED,FINAL    Transfusion Status OK TO TRANSFUSE    Unit Number [id number]    Blood Component Type THAWED PLASMA    Unit division 00    Status of Unit ISSUED,FINAL    Transfusion Status OK TO TRANSFUSE   Hemoglobin and hematocrit, blood     Status: Abnormal   Collection Time: 02/04/15 12:20 PM  Result Value Ref Range   Hemoglobin 8.5 (L) 13.0 - 17.0 g/dL    Comment: REPEATED TO VERIFY   HCT 24.0 (L) 39.0 - 52.0 %  Platelet count     Status: Abnormal   Collection Time: 02/04/15 12:20 PM  Result Value Ref Range   Platelets 104 (L) 150 - 400 K/uL    Comment: PLATELET COUNT CONFIRMED BY SMEAR  I-STAT, chem 8     Status: Abnormal   Collection Time: 02/04/15  1:00 PM  Result Value Ref Range   Sodium 132 (L) 135 - 145 mmol/L   Potassium 6.0 (H) 3.5 - 5.1 mmol/L   Chloride 99 (L) 101 - 111 mmol/L   BUN 18 6 - 20 mg/dL   Creatinine, Ser 1.00 0.61 - 1.24  mg/dL   Glucose, Bld 197 (H) 65 - 99 mg/dL   Calcium, Ion 1.04 (L) 1.12 - 1.23 mmol/L   TCO2 22 0 - 100 mmol/L   Hemoglobin 8.5 (L) 13.0 - 17.0 g/dL   HCT 25.0 (L) 39.0 - 52.0 %  I-STAT, chem 8     Status: Abnormal   Collection Time: 02/04/15  1:52 PM  Result Value Ref Range   Sodium 135 135 - 145 mmol/L   Potassium 4.5 3.5 - 5.1 mmol/L  Chloride 100 (L) 101 - 111 mmol/L   BUN 17 6 - 20 mg/dL   Creatinine, Ser 1.00 0.61 - 1.24 mg/dL   Glucose, Bld 173 (H) 65 - 99 mg/dL   Calcium, Ion 1.04 (L) 1.12 - 1.23 mmol/L   TCO2 20 0 - 100 mmol/L   Hemoglobin 7.8 (L) 13.0 - 17.0 g/dL   HCT 23.0 (L) 39.0 - 52.0 %  I-STAT 3, arterial blood gas (G3+)     Status: Abnormal   Collection Time: 02/04/15  1:55 PM  Result Value Ref Range   pH, Arterial 7.300 (L) 7.350 - 7.450   pCO2 arterial 46.5 (H) 35.0 - 45.0 mmHg   pO2, Arterial 155.0 (H) 80.0 - 100.0 mmHg   Bicarbonate 22.9 20.0 - 24.0 mEq/L   TCO2 24 0 - 100 mmol/L   O2 Saturation 99.0 %   Acid-base deficit 3.0 (H) 0.0 - 2.0 mmol/L   Sample type ARTERIAL   I-STAT, chem 8     Status: Abnormal   Collection Time: 02/04/15  2:47 PM  Result Value Ref Range   Sodium 136 135 - 145 mmol/L   Potassium 4.2 3.5 - 5.1 mmol/L   Chloride 101 101 - 111 mmol/L   BUN 16 6 - 20 mg/dL   Creatinine, Ser 1.00 0.61 - 1.24 mg/dL   Glucose, Bld 204 (H) 65 - 99 mg/dL   Calcium, Ion 1.09 (L) 1.12 - 1.23 mmol/L   TCO2 20 0 - 100 mmol/L   Hemoglobin 10.5 (L) 13.0 - 17.0 g/dL   HCT 31.0 (L) 39.0 - 52.0 %  CBC     Status: Abnormal   Collection Time: 02/04/15  4:08 PM  Result Value Ref Range   WBC 21.6 (H) 4.0 - 10.5 K/uL   RBC 3.85 (L) 4.22 - 5.81 MIL/uL   Hemoglobin 11.5 (L) 13.0 - 17.0 g/dL   HCT 33.7 (L) 39.0 - 52.0 %   MCV 87.5 78.0 - 100.0 fL   MCH 29.9 26.0 - 34.0 pg   MCHC 34.1 30.0 - 36.0 g/dL   RDW 13.6 11.5 - 15.5 %   Platelets 147 (L) 150 - 400 K/uL  Protime-INR     Status: Abnormal   Collection Time: 02/04/15  4:08 PM  Result Value Ref Range    Prothrombin Time 17.9 (H) 11.6 - 15.2 seconds   INR 1.47 0.00 - 1.49  APTT     Status: Abnormal   Collection Time: 02/04/15  4:08 PM  Result Value Ref Range   aPTT 39 (H) 24 - 37 seconds    Comment:        IF BASELINE aPTT IS ELEVATED, SUGGEST PATIENT RISK ASSESSMENT BE USED TO DETERMINE APPROPRIATE ANTICOAGULANT THERAPY.   I-STAT 4, (NA,K, GLUC, HGB,HCT)     Status: Abnormal   Collection Time: 02/04/15  4:08 PM  Result Value Ref Range   Sodium 136 135 - 145 mmol/L   Potassium 4.0 3.5 - 5.1 mmol/L   Glucose, Bld 194 (H) 65 - 99 mg/dL   HCT 34.0 (L) 39.0 - 52.0 %   Hemoglobin 11.6 (L) 13.0 - 17.0 g/dL  I-STAT 3, arterial blood gas (G3+)     Status: Abnormal   Collection Time: 02/04/15  4:12 PM  Result Value Ref Range   pH, Arterial 7.286 (L) 7.350 - 7.450   pCO2 arterial 45.6 (H) 35.0 - 45.0 mmHg   pO2, Arterial 69.0 (L) 80.0 - 100.0 mmHg   Bicarbonate 22.1 20.0 - 24.0 mEq/L  TCO2 24 0 - 100 mmol/L   O2 Saturation 93.0 %   Acid-base deficit 5.0 (H) 0.0 - 2.0 mmol/L   Patient temperature 35.4 C    Sample type ARTERIAL   Glucose, capillary     Status: Abnormal   Collection Time: 02/04/15  5:22 PM  Result Value Ref Range   Glucose-Capillary 160 (H) 65 - 99 mg/dL   Comment 1 Arterial Specimen    Comment 2 Glucose Stabilizer   I-STAT 3, arterial blood gas (G3+)     Status: Abnormal   Collection Time: 02/04/15  5:26 PM  Result Value Ref Range   pH, Arterial 7.330 (L) 7.350 - 7.450   pCO2 arterial 38.8 35.0 - 45.0 mmHg   pO2, Arterial 74.0 (L) 80.0 - 100.0 mmHg   Bicarbonate 20.6 20.0 - 24.0 mEq/L   TCO2 22 0 - 100 mmol/L   O2 Saturation 94.0 %   Acid-base deficit 5.0 (H) 0.0 - 2.0 mmol/L   Patient temperature 36.2 C    Collection site ARTERIAL LINE    Drawn by Nurse    Sample type ARTERIAL   Glucose, capillary     Status: Abnormal   Collection Time: 02/04/15  6:26 PM  Result Value Ref Range   Glucose-Capillary 144 (H) 65 - 99 mg/dL   Comment 1 Arterial Specimen      Comment 2 Glucose Stabilizer   Glucose, capillary     Status: Abnormal   Collection Time: 02/04/15  7:34 PM  Result Value Ref Range   Glucose-Capillary 183 (H) 65 - 99 mg/dL  Glucose, capillary     Status: Abnormal   Collection Time: 02/04/15  8:41 PM  Result Value Ref Range   Glucose-Capillary 205 (H) 65 - 99 mg/dL  I-STAT, chem 8     Status: Abnormal   Collection Time: 02/04/15  9:38 PM  Result Value Ref Range   Sodium 136 135 - 145 mmol/L   Potassium 3.6 3.5 - 5.1 mmol/L   Chloride 100 (L) 101 - 111 mmol/L   BUN 16 6 - 20 mg/dL   Creatinine, Ser 1.20 0.61 - 1.24 mg/dL   Glucose, Bld 223 (H) 65 - 99 mg/dL   Calcium, Ion 1.19 1.12 - 1.23 mmol/L   TCO2 18 0 - 100 mmol/L   Hemoglobin 10.2 (L) 13.0 - 17.0 g/dL   HCT 30.0 (L) 39.0 - 52.0 %  Glucose, capillary     Status: Abnormal   Collection Time: 02/04/15  9:38 PM  Result Value Ref Range   Glucose-Capillary 216 (H) 65 - 99 mg/dL  CBC     Status: Abnormal   Collection Time: 02/04/15  9:40 PM  Result Value Ref Range   WBC 17.2 (H) 4.0 - 10.5 K/uL   RBC 3.53 (L) 4.22 - 5.81 MIL/uL   Hemoglobin 10.5 (L) 13.0 - 17.0 g/dL   HCT 30.5 (L) 39.0 - 52.0 %   MCV 86.4 78.0 - 100.0 fL   MCH 29.7 26.0 - 34.0 pg   MCHC 34.4 30.0 - 36.0 g/dL   RDW 13.5 11.5 - 15.5 %   Platelets 159 150 - 400 K/uL  Creatinine, serum     Status: Abnormal   Collection Time: 02/04/15  9:40 PM  Result Value Ref Range   Creatinine, Ser 1.46 (H) 0.61 - 1.24 mg/dL   GFR calc non Af Amer 51 (L) >60 mL/min   GFR calc Af Amer 59 (L) >60 mL/min    Comment: (NOTE) The eGFR has  been calculated using the CKD EPI equation. This calculation has not been validated in all clinical situations. eGFR's persistently <60 mL/min signify possible Chronic Kidney Disease.   Magnesium     Status: Abnormal   Collection Time: 02/04/15  9:40 PM  Result Value Ref Range   Magnesium 2.8 (H) 1.7 - 2.4 mg/dL  Glucose, capillary     Status: Abnormal   Collection Time: 02/04/15  10:48 PM  Result Value Ref Range   Glucose-Capillary 173 (H) 65 - 99 mg/dL  I-STAT 3, arterial blood gas (G3+)     Status: Abnormal   Collection Time: 02/04/15 11:00 PM  Result Value Ref Range   pH, Arterial 7.326 (L) 7.350 - 7.450   pCO2 arterial 39.4 35.0 - 45.0 mmHg   pO2, Arterial 83.0 80.0 - 100.0 mmHg   Bicarbonate 20.7 20.0 - 24.0 mEq/L   TCO2 22 0 - 100 mmol/L   O2 Saturation 96.0 %   Acid-base deficit 5.0 (H) 0.0 - 2.0 mmol/L   Patient temperature 36.4 C    Collection site ARTERIAL LINE    Drawn by Nurse    Sample type ARTERIAL   Glucose, capillary     Status: Abnormal   Collection Time: 02/04/15 11:45 PM  Result Value Ref Range   Glucose-Capillary 148 (H) 65 - 99 mg/dL   Comment 1 Arterial Specimen   Glucose, capillary     Status: Abnormal   Collection Time: 02/05/15 12:46 AM  Result Value Ref Range   Glucose-Capillary 117 (H) 65 - 99 mg/dL  Glucose, capillary     Status: Abnormal   Collection Time: 02/05/15  1:45 AM  Result Value Ref Range   Glucose-Capillary 102 (H) 65 - 99 mg/dL  Glucose, capillary     Status: None   Collection Time: 02/05/15  2:46 AM  Result Value Ref Range   Glucose-Capillary 75 65 - 99 mg/dL  Glucose, capillary     Status: None   Collection Time: 02/05/15  3:50 AM  Result Value Ref Range   Glucose-Capillary 80 65 - 99 mg/dL  Glucose, capillary     Status: Abnormal   Collection Time: 02/05/15  4:43 AM  Result Value Ref Range   Glucose-Capillary 105 (H) 65 - 99 mg/dL  CBC     Status: Abnormal   Collection Time: 02/05/15  4:45 AM  Result Value Ref Range   WBC 14.7 (H) 4.0 - 10.5 K/uL   RBC 3.05 (L) 4.22 - 5.81 MIL/uL   Hemoglobin 9.3 (L) 13.0 - 17.0 g/dL   HCT 26.1 (L) 39.0 - 52.0 %   MCV 85.6 78.0 - 100.0 fL   MCH 30.5 26.0 - 34.0 pg   MCHC 35.6 30.0 - 36.0 g/dL   RDW 13.6 11.5 - 15.5 %   Platelets 122 (L) 150 - 400 K/uL  Basic metabolic panel     Status: Abnormal   Collection Time: 02/05/15  4:45 AM  Result Value Ref Range    Sodium 137 135 - 145 mmol/L   Potassium 4.7 3.5 - 5.1 mmol/L    Comment: DELTA CHECK NOTED   Chloride 105 101 - 111 mmol/L   CO2 27 22 - 32 mmol/L   Glucose, Bld 101 (H) 65 - 99 mg/dL   BUN 15 6 - 20 mg/dL   Creatinine, Ser 1.21 0.61 - 1.24 mg/dL   Calcium 8.0 (L) 8.9 - 10.3 mg/dL   GFR calc non Af Amer >60 >60 mL/min   GFR calc Af Amer >  60 >60 mL/min    Comment: (NOTE) The eGFR has been calculated using the CKD EPI equation. This calculation has not been validated in all clinical situations. eGFR's persistently <60 mL/min signify possible Chronic Kidney Disease.    Anion gap 5 5 - 15  Magnesium     Status: None   Collection Time: 02/05/15  4:45 AM  Result Value Ref Range   Magnesium 2.3 1.7 - 2.4 mg/dL  I-STAT 3, arterial blood gas (G3+)     Status: Abnormal   Collection Time: 02/05/15  4:47 AM  Result Value Ref Range   pH, Arterial 7.372 7.350 - 7.450   pCO2 arterial 42.6 35.0 - 45.0 mmHg   pO2, Arterial 76.0 (L) 80.0 - 100.0 mmHg   Bicarbonate 24.8 (H) 20.0 - 24.0 mEq/L   TCO2 26 0 - 100 mmol/L   O2 Saturation 95.0 %   Acid-base deficit 1.0 0.0 - 2.0 mmol/L   Patient temperature 37.0 C    Collection site ARTERIAL LINE    Drawn by Nurse    Sample type ARTERIAL   Glucose, capillary     Status: Abnormal   Collection Time: 02/05/15  5:42 AM  Result Value Ref Range   Glucose-Capillary 104 (H) 65 - 99 mg/dL  Glucose, capillary     Status: Abnormal   Collection Time: 02/05/15  6:42 AM  Result Value Ref Range   Glucose-Capillary 103 (H) 65 - 99 mg/dL   Comment 1 Arterial Specimen   CBC     Status: Abnormal   Collection Time: 02/05/15 12:40 PM  Result Value Ref Range   WBC 14.1 (H) 4.0 - 10.5 K/uL   RBC 3.10 (L) 4.22 - 5.81 MIL/uL   Hemoglobin 9.4 (L) 13.0 - 17.0 g/dL   HCT 26.9 (L) 39.0 - 52.0 %   MCV 86.8 78.0 - 100.0 fL   MCH 30.3 26.0 - 34.0 pg   MCHC 34.9 30.0 - 36.0 g/dL   RDW 13.7 11.5 - 15.5 %   Platelets 90 (L) 150 - 400 K/uL    Comment: REPEATED TO  VERIFY PLATELET COUNT CONFIRMED BY SMEAR     Imaging: Imaging results have been reviewed  Assessment/Plan:   1. Principal Problem: 2.   NSTEMI (non-ST elevated myocardial infarction) 3. Active Problems: 4.   Chest pain 5.   Coronary artery disease 6.   Dyslipidemia 7.   Tobacco abuse 8.   S/P CABG x 2 9.   Time Spent Directly with Patient:  15 minutes  Length of Stay:  LOS: 8 days   POD #1 re do CABG X2. NSTEMI. All SVGs occluded. Patent LIMA, preserved LV FXN. NSR. VSS. Good sats. Nl progression. Low dose BB and diuretic. CRH. Tx tele per TCTS. Nl progression.  [name] 02/05/2015, 1:54 PM

## 2015-02-05 NOTE — Progress Notes (Signed)
Utilization Review Completed.  

## 2015-02-05 NOTE — Progress Notes (Signed)
Patient ID: [name], male   DOB: Dec 30, 1955, 59 y.o.   MRN: [id number] EVENING ROUNDS NOTE :     [address]       [place] [address]             [phone]                 1 Day Post-Op Procedure(s) (LRB): REDO CORONARY ARTERY BYPASS GRAFTING x two, using left leg greater saphenous vein harvested endoscopically.  (N/A) TRANSESOPHAGEAL ECHOCARDIOGRAM (TEE) (N/A)  Total Length of Stay:  LOS: 8 days  BP 86/56 mmHg  Pulse 77  Temp(Src) 98.1 F (36.7 C) (Oral)  Resp 15  Ht 6\' 2"  (1.88 m)  Wt 233 lb 11 oz (106 kg)  BMI 29.99 kg/m2  SpO2 96%  .Intake/Output      06/13 0701 - 06/14 0700 06/14 0701 - 06/15 0700   I.V. (mL/kg) 7752.5 (73.1) 323.4 (3.1)   Blood 1711    NG/GT 30    IV Piggyback 1700    Total Intake(mL/kg) 11193.5 (105.6) 323.4 (3.1)   Urine (mL/kg/hr) 2955 (1.2) 1825 (1.6)   Emesis/NG output 150 (0.1)    Blood 1200 (0.5)    Chest Tube 1375 (0.5) 330 (0.3)   Total Output 5680 2155   Net +5513.5 -1831.6          . sodium chloride 20 mL/hr at 02/04/15 1627  . sodium chloride    . sodium chloride    . dexmedetomidine Stopped (02/04/15 2330)  . EPINEPHrine 4 mg in dextrose 5% 250 mL infusion (16 mcg/mL) Stopped (02/04/15 2200)  . furosemide (LASIX) infusion 4 mg/hr (02/05/15 1600)  . insulin (NOVOLIN-R) infusion 2.8 Units/hr (02/05/15 0600)  . lactated ringers Stopped (02/04/15 2200)  . lactated ringers 20 mL/hr at 02/04/15 2000  . milrinone 0.3 mcg/kg/min (02/05/15 1600)  . nitroGLYCERIN    . norepinephrine (LEVOPHED) Adult infusion Stopped (02/05/15 1400)     Lab Results  Component Value Date   WBC 14.1* 02/05/2015   HGB 9.4* 02/05/2015   HCT 26.9* 02/05/2015   PLT 90* 02/05/2015   GLUCOSE 101* 02/05/2015   CHOL 187 01/29/2015   TRIG 76 01/29/2015   HDL 31* 01/29/2015   LDLCALC 141* 01/29/2015   ALT 21 01/28/2015   AST 72* 01/28/2015   NA 137 02/05/2015   K 4.7 02/05/2015   CL 105 02/05/2015   CREATININE 1.21  02/05/2015   BUN 15 02/05/2015   CO2 27 02/05/2015   TSH 2.490 01/29/2015   INR 1.47 02/04/2015   HGBA1C 5.9* 01/30/2015   Stable day   Grace Isaac MD  Beeper [phone] Office [phone] 02/05/2015 5:30 PM

## 2015-02-06 ENCOUNTER — Inpatient Hospital Stay (HOSPITAL_COMMUNITY): Payer: Medicare Other

## 2015-02-06 LAB — POCT I-STAT, CHEM 8
BUN: 22 mg/dL — ABNORMAL HIGH (ref 6–20)
CALCIUM ION: 1.17 mmol/L (ref 1.12–1.23)
CHLORIDE: 90 mmol/L — AB (ref 101–111)
Creatinine, Ser: 1.5 mg/dL — ABNORMAL HIGH (ref 0.61–1.24)
Glucose, Bld: 126 mg/dL — ABNORMAL HIGH (ref 65–99)
HCT: 31 % — ABNORMAL LOW (ref 39.0–52.0)
Hemoglobin: 10.5 g/dL — ABNORMAL LOW (ref 13.0–17.0)
Potassium: 4.5 mmol/L (ref 3.5–5.1)
Sodium: 130 mmol/L — ABNORMAL LOW (ref 135–145)
TCO2: 28 mmol/L (ref 0–100)

## 2015-02-06 LAB — BASIC METABOLIC PANEL
Anion gap: 5 (ref 5–15)
BUN: 16 mg/dL (ref 6–20)
CO2: 31 mmol/L (ref 22–32)
Calcium: 7.9 mg/dL — ABNORMAL LOW (ref 8.9–10.3)
Chloride: 96 mmol/L — ABNORMAL LOW (ref 101–111)
Creatinine, Ser: 1.45 mg/dL — ABNORMAL HIGH (ref 0.61–1.24)
GFR calc Af Amer: 59 mL/min — ABNORMAL LOW (ref >60)
GFR calc non Af Amer: 51 mL/min — ABNORMAL LOW (ref >60)
Glucose, Bld: 116 mg/dL — ABNORMAL HIGH (ref 65–99)
Potassium: 4.3 mmol/L (ref 3.5–5.1)
Sodium: 132 mmol/L — ABNORMAL LOW (ref 135–145)

## 2015-02-06 LAB — GLUCOSE, CAPILLARY
GLUCOSE-CAPILLARY: 119 mg/dL — AB (ref 65–99)
GLUCOSE-CAPILLARY: 123 mg/dL — AB (ref 65–99)
Glucose-Capillary: 105 mg/dL — ABNORMAL HIGH (ref 65–99)
Glucose-Capillary: 116 mg/dL — ABNORMAL HIGH (ref 65–99)
Glucose-Capillary: 117 mg/dL — ABNORMAL HIGH (ref 65–99)
Glucose-Capillary: 113 mg/dL — ABNORMAL HIGH (ref 65–99)
Glucose-Capillary: 92 mg/dL (ref 65–99)
Glucose-Capillary: 134 mg/dL — ABNORMAL HIGH (ref 65–99)
Glucose-Capillary: 107 mg/dL — ABNORMAL HIGH (ref 65–99)
Glucose-Capillary: 120 mg/dL — ABNORMAL HIGH (ref 65–99)

## 2015-02-06 LAB — CBC
HCT: 24.5 % — ABNORMAL LOW (ref 39.0–52.0)
Hemoglobin: 8.5 g/dL — ABNORMAL LOW (ref 13.0–17.0)
MCH: 30.5 pg (ref 26.0–34.0)
MCHC: 34.7 g/dL (ref 30.0–36.0)
MCV: 87.8 fL (ref 78.0–100.0)
Platelets: 86 10*3/uL — ABNORMAL LOW (ref 150–400)
RBC: 2.79 MIL/uL — ABNORMAL LOW (ref 4.22–5.81)
RDW: 14 % (ref 11.5–15.5)
WBC: 10.4 10*3/uL (ref 4.0–10.5)

## 2015-02-06 LAB — CARBOXYHEMOGLOBIN
Carboxyhemoglobin: 1 % (ref 0.5–1.5)
Methemoglobin: 1.2 % (ref 0.0–1.5)
O2 Saturation: 53.3 %
Total hemoglobin: 9.6 g/dL — ABNORMAL LOW (ref 13.5–18.0)

## 2015-02-06 MED ORDER — ZOLPIDEM TARTRATE 5 MG PO TABS
10.0000 mg | ORAL_TABLET | Freq: Every evening | ORAL | Status: DC | PRN
Start: 2015-02-06 — End: 2015-02-10
  Administered 2015-02-06 – 2015-02-09 (×4): 10 mg via ORAL
  Filled 2015-02-06 (×4): qty 2

## 2015-02-06 MED ORDER — FUROSEMIDE 10 MG/ML IJ SOLN
20.0000 mg | Freq: Two times a day (BID) | INTRAMUSCULAR | Status: AC
  Administered 2015-02-06 – 2015-02-07 (×3): 20 mg via INTRAVENOUS
  Filled 2015-02-06 (×5): qty 2

## 2015-02-06 MED ORDER — HYDROCODONE-ACETAMINOPHEN 10-325 MG PO TABS
1.0000 | ORAL_TABLET | ORAL | Status: DC | PRN
  Administered 2015-02-06 – 2015-02-09 (×5): 1 via ORAL
  Filled 2015-02-06 (×5): qty 1

## 2015-02-06 MED ORDER — MILRINONE IN DEXTROSE 20 MG/100ML IV SOLN: 0.1250 ug/kg/min | INTRAVENOUS | Status: DC

## 2015-02-06 MED ORDER — ASPIRIN EC 81 MG PO TBEC
81.0000 mg | DELAYED_RELEASE_TABLET | Freq: Every day | ORAL | Status: DC
  Administered 2015-02-06 – 2015-02-10 (×5): 81 mg via ORAL
  Filled 2015-02-06 (×5): qty 1

## 2015-02-06 NOTE — Op Note (Signed)
[name], [name] NO.:  [id number]  MEDICAL RECORD NO.:  [id number]  LOCATION:  [facility]                       FACILITY:  [facility]  PHYSICIAN:  [name], M.D.  DATE OF BIRTH:  [birth year]  DATE OF PROCEDURE:  02/04/2015 DATE OF DISCHARGE:  02/04/2015                              OPERATIVE REPORT   OPERATION: 1. Redo coronary artery bypass grafting x2 (saphenous vein graft to     posterior descending, saphenous vein graft to optional diagonal). 2. Left leg endoscopic saphenous vein harvest. 3. Placement of femoral A-line for blood pressure monitoring.  SURGEON:  [name], M.D.  ASSISTANT:  Barry Handler, PA-C.  ANESTHESIA:  General by Dr. Albertha Spencer.  PREOPERATIVE DIAGNOSES:  Unstable angina, non-ST elevation myocardial infarction, previous coronary artery bypass grafting x4 in [place], [place].  POSTOPERATIVE DIAGNOSES:  Unstable angina, non-ST elevation myocardial infarction, previous coronary artery bypass grafting x4 in [place], [place].  CLINICAL NOTE:  The patient is a 59 year old Caucasian male, diabetic, smoker, who presents with unstable angina and positive cardiac enzymes. He underwent multivessel CABG 10 years ago at [facility] in [place].  Cardiac catheterization demonstrated a patent left IMA to the LAD, but occluded vein grafts to the right coronary, OM, and diagonal.  Ejection fraction was 45% to 50%.  His native coronary disease was severe and he was not felt to be a candidate for percutaneous intervention.  I examined the patient in the CCU and reviewed results of his cardiac catheterization with the patient and his family.  I discussed the indications and the benefits of redo CABG for treatment of his severe multivessel coronary artery disease.  I discussed with them the major details of surgery including the use of general anesthesia and cardiopulmonary bypass, location of the  surgical incisions, and the expected postoperative recovery.  I discussed with him the specific risks to him of redo CABG including risks of bleeding, blood transfusion, stroke, MI, postoperative pulmonary problems including pleural effusion, infection, and death.  After reviewing these issues, he demonstrated his understanding and agreed to proceed with surgery under what I felt was an informed consent.  OPERATIVE FINDINGS: 1. Severe diffuse coronary disease - the patient would not be a     candidate for a third time CABG. 2. Intraoperative anemia requiring 2 units of blood transfusion. 3. Allergic reaction to protamine versus blood product transfusion     with urticarial rash and hypotension, treated and responsive to     medication. 4. Postoperative left pneumothorax by portable chest x-ray in the OR     for which a left chest tube was placed.  OPERATIVE PROCEDURE:  The patient was brought to the operating room and placed supine on the operating table.  General anesthesia was induced under invasive hemodynamic monitoring.  The chest, abdomen, and legs were prepped with Betadine and draped as a sterile field.  A proper time- out was performed.  A femoral A-line was placed in the left femoral artery for blood pressure monitoring as the radial artery line was dampened.  The patient also had a 5-French venous sheath were placed in the right femoral vein in case he needed rapid cannulation for cardiopulmonary bypass.  A sternal incision was made and the sternal wires were removed.  The saphenous vein was harvested endoscopically from the left leg.  The redo sternotomy was performed using the oscillating saw with care being taken to avoid injury to the underlying structures.  The patient remained stable.  The sternum was gently separated.  The anterior mediastinum had dense adhesions, which were carefully dissected down to expose the anterior surface of the heart, the ascending aorta,  and the occluded vein grafts.  A sternal elevating retractor was then to elevate the left side of the sternum for further dissection and for identification of the internal mammary artery pedicle, which was patent.  This was gently surrounded with a vessel loop and was protected at all times.  The sternal elevating retractor was then replaced with the Ankeney retractor and further dissection of the aorta and the inferior wall of the left ventricle was completed without any instability.  Heparin was then administered and pursestrings were placed in the ascending aorta and right atrium, and the patient was cannulated and placed on cardiopulmonary bypass after the ACT was documented as being therapeutic.  Further dissection was carried out without lifting the heart.  Cardioplegia cannulas were placed for both antegrade and retrograde cold blood cardioplegia and the patient was cooled to 32 degrees.  The aortic crossclamp was applied.  One liter of cold blood cardioplegia was delivered in split doses between the antegrade aortic and retrograde coronary sinus catheters.  A soft vascular clamp was placed on the mammary pedicle while the crossclamp was on the aorta.  The dissection was then completed with the heart arrested.  The vein graft to the distal right was extremely calcified and tortuous and heavily diseased.  This was carefully dissected so as only to identify a site for the distal anastomosis distal to the previous vein graft to the posterior descending.  The posterior descending at the point of the anastomosis was diseased, but adequate target.  Next, the lateral wall of the heart was identified.  The vein graft to the diagonal and OM were identified.  The OM was atretic and non- graftable.  Extensive dissection for the diagonal vessel was undertaken. The vessel was identified and was approximately 1.2 mm in diameter.  Cardioplegia was redosed.  The distal coronary anastomoses  were then performed.  First distal anastomosis was the posterior descending branch of the right coronary.  A reverse saphenous vein of fairly good quality was sewn end-to-side with running 7-0 Prolene.  There was some bleeding from the distal anastomosis, so the anastomosis was completely revised.  With revised anastomosis, there was excellent flow through the graft and no bleeding.  Cardioplegia was redosed.  The second distal anastomosis was to the diagonal branch of the left coronary.  A reverse saphenous vein of larger caliber was sewn end-to- side with running 7-0 Prolene, which was challenging because of the smaller size of the target native coronary vessel.  After this was sewn, cardioplegia was then again dosed to the coronary graft through the vein grafts as well as through the retrograde coronary sinus catheter.  Next, the 2 proximal anastomoses were performed while the patient was being warmed and while the crossclamp was still in place.  They were performed using a 4.5 mm punch running 6-0 Prolene.  Prior to removing the crossclamp, air was vented from the coronaries with a dose of retrograde warm blood cardioplegia.  The heart resumed a spontaneous rhythm.  The vein grafts were de-aired and opened,  each had good flow, and hemostasis was documented at the proximal and distal anastomoses.  The mammary pedicle had been unclamped just before removing the crossclamp to help remove any anti-coronary air.  The patient was rewarmed and reperfused.  Temporary pacing wires were applied.  The lungs re-expanded.  The right pleural space was opened and pleural effusion was suctioned off.  There was no evidence of entry into the left pleural space, which was densely covered with adhesions and no further dissection was performed to try to enter the left pleural space because of the strong possibility of bleeding.  There was diffuse coagulopathy felt and platelets and FFP were  ordered.  The patient was started on low-dose milrinone and norepinephrine.  After the lungs re-expanded, the patient was fully rewarmed and reperfused. He was weaned off cardiopulmonary bypass without difficulty.  The echo showed improved overall global LV function.  There was only minimal mitral regurgitation.  Protamine was administered.  The cardiac output was acceptable.  The patient during this period of time after separation from cardiopulmonary bypass and decannulation was noted to have an urticarial rash either as a reaction to protamine or to one of the blood products as the patient had received FFP initially and then a unit of platelets.  The patient was treated with Solu-Medrol and Benadryl and required transient increased dose in the Levophed to maintain adequate perfusion pressure.  Cardiac function by echo remained excellent and was stable.  Painstaking efforts were made to obtain adequate hemostasis.  Platelets and FFP helped reverse the coagulopathy.  A right pleural and an anterior mediastinal and posterior mediastinal tube were placed and brought out through separate incisions.  The superior pericardial fat was closed over the aorta and vein grafts.  The sternum was closed with interrupted steel wire.  The pectoralis fascia was closed in running #1 Vicryl.  The subcutaneous and skin layers were closed in running Vicryl.  After removing the drapes, a chest x-ray was performed, which showed a left basilar pneumothorax at the costophrenic angle.  The left chest was re-prepped and draped and a 28-French straight chest tube was placed, which removed air and some serosanguineous fluid.  This was connected to a separate Pleur-Evac drainage chamber and the chest tube was secured to the skin with silk suture.  The patient then returned to the ICU in stable condition.  Total cardiopulmonary bypass time was 160 minutes.     [name], M.D.     PV/MEDQ  D:   02/05/2015  T:  02/06/2015  Job:  [id number]

## 2015-02-06 NOTE — Care Management Note (Signed)
Case Management Note  Patient Details  Name: Takeru Bose MRN: [id number] Date of Birth: 1956/02/24  Subjective/Objective:  Pt lives with spouse who will be available to provide assistance 24/7 when pt medically ready to discharge home.  Pt reports he plans to ambulate later today, was OOB to chair this a.m.                                  Expected Discharge Plan:  Home/Self Care  In-House Referral:     Discharge planning Services     Post Acute Care Choice:    Choice offered to:     DME Arranged:    DME Agency:     HH Arranged:    HH Agency:     Status of Service:     Medicare Important Message Given:  Yes Date Medicare IM Given:  01/31/15 Medicare IM give by:  debbie dowell rn,bsn Date Additional Medicare IM Given:  02/06/15 Additional Medicare Important Message give by:  Babette Relic RN  If discussed at [place] of Stay Meetings, dates discussed:    Additional Comments:  Girard Cooter, RN 02/06/2015, 2:10 PM

## 2015-02-06 NOTE — Progress Notes (Signed)
Patient ID: [name], male   DOB: August 07, 1956, 60 y.o.   MRN: [id number]  SICU Evening rounds  Hemodynamically stable on milrinone 0.125.  Excellent diuresis.  BMET    Component Value Date/Time   NA 130* 02/06/2015 1652   K 4.5 02/06/2015 1652   CL 90* 02/06/2015 1652   CO2 31 02/06/2015 0444   GLUCOSE 126* 02/06/2015 1652   BUN 22* 02/06/2015 1652   CREATININE 1.50* 02/06/2015 1652   CALCIUM 7.9* 02/06/2015 0444   GFRNONAA 51* 02/06/2015 0444   GFRAA 59* 02/06/2015 0444    CBC    Component Value Date/Time   WBC 10.4 02/06/2015 0444   RBC 2.79* 02/06/2015 0444   HGB 10.5* 02/06/2015 1652   HCT 31.0* 02/06/2015 1652   PLT 86* 02/06/2015 0444   MCV 87.8 02/06/2015 0444   MCH 30.5 02/06/2015 0444   MCHC 34.7 02/06/2015 0444   RDW 14.0 02/06/2015 0444    Ambulated around the ICU a few laps.  A/P: stable. Continue current plan.

## 2015-02-07 ENCOUNTER — Ambulatory Visit: Payer: Self-pay | Admitting: Cardiology

## 2015-02-07 ENCOUNTER — Inpatient Hospital Stay (HOSPITAL_COMMUNITY): Payer: Medicare Other

## 2015-02-07 LAB — CBC
HCT: 25 % — ABNORMAL LOW (ref 39.0–52.0)
Hemoglobin: 8.5 g/dL — ABNORMAL LOW (ref 13.0–17.0)
MCH: 30 pg (ref 26.0–34.0)
MCHC: 34 g/dL (ref 30.0–36.0)
MCV: 88.3 fL (ref 78.0–100.0)
Platelets: 101 10*3/uL — ABNORMAL LOW (ref 150–400)
RBC: 2.83 MIL/uL — ABNORMAL LOW (ref 4.22–5.81)
RDW: 13.5 % (ref 11.5–15.5)
WBC: 8.7 10*3/uL (ref 4.0–10.5)

## 2015-02-07 LAB — TYPE AND SCREEN
ABO/RH(D): A POS
Antibody Screen: NEGATIVE
Unit division: 0
Unit division: 0
Unit division: 0
Unit division: 0

## 2015-02-07 LAB — COMPREHENSIVE METABOLIC PANEL
ALT: 20 U/L (ref 17–63)
AST: 34 U/L (ref 15–41)
Albumin: 2.7 g/dL — ABNORMAL LOW (ref 3.5–5.0)
Alkaline Phosphatase: 41 U/L (ref 38–126)
Anion gap: 6 (ref 5–15)
BUN: 19 mg/dL (ref 6–20)
CO2: 33 mmol/L — ABNORMAL HIGH (ref 22–32)
Calcium: 8.1 mg/dL — ABNORMAL LOW (ref 8.9–10.3)
Chloride: 92 mmol/L — ABNORMAL LOW (ref 101–111)
Creatinine, Ser: 1.31 mg/dL — ABNORMAL HIGH (ref 0.61–1.24)
GFR calc Af Amer: >60 mL/min (ref >60)
GFR calc non Af Amer: 58 mL/min — ABNORMAL LOW (ref >60)
Glucose, Bld: 115 mg/dL — ABNORMAL HIGH (ref 65–99)
Potassium: 4 mmol/L (ref 3.5–5.1)
Sodium: 131 mmol/L — ABNORMAL LOW (ref 135–145)
Total Bilirubin: 1.3 mg/dL — ABNORMAL HIGH (ref 0.3–1.2)
Total Protein: 5.3 g/dL — ABNORMAL LOW (ref 6.5–8.1)

## 2015-02-07 LAB — CARBOXYHEMOGLOBIN
Carboxyhemoglobin: 1.4 % (ref 0.5–1.5)
Methemoglobin: 1 % (ref 0.0–1.5)
O2 Saturation: 68.3 %
Total hemoglobin: 8.7 g/dL — ABNORMAL LOW (ref 13.5–18.0)

## 2015-02-07 LAB — GLUCOSE, CAPILLARY
GLUCOSE-CAPILLARY: 117 mg/dL — AB (ref 65–99)
Glucose-Capillary: 102 mg/dL — ABNORMAL HIGH (ref 65–99)
Glucose-Capillary: 107 mg/dL — ABNORMAL HIGH (ref 65–99)
Glucose-Capillary: 99 mg/dL (ref 65–99)
Glucose-Capillary: 104 mg/dL — ABNORMAL HIGH (ref 65–99)
Glucose-Capillary: 105 mg/dL — ABNORMAL HIGH (ref 65–99)

## 2015-02-07 MED ORDER — MAGNESIUM HYDROXIDE 400 MG/5ML PO SUSP: 30.0000 mL | Freq: Every day | ORAL | Status: DC | PRN

## 2015-02-07 MED ORDER — INSULIN ASPART 100 UNIT/ML ~~LOC~~ SOLN
0.0000 [IU] | Freq: Three times a day (TID) | SUBCUTANEOUS | Status: DC
  Administered 2015-02-08: 2 [IU] via SUBCUTANEOUS

## 2015-02-07 MED ORDER — DEXTROSE 5 % IV SOLN: 1.0000 g | Freq: Three times a day (TID) | INTRAVENOUS | Status: DC

## 2015-02-07 MED ORDER — POTASSIUM CHLORIDE 10 MEQ/50ML IV SOLN
10.0000 meq | INTRAVENOUS | Status: AC
  Administered 2015-02-07 (×2): 10 meq via INTRAVENOUS
  Filled 2015-02-07 (×2): qty 50

## 2015-02-07 MED ORDER — FUROSEMIDE 40 MG PO TABS
40.0000 mg | ORAL_TABLET | Freq: Every day | ORAL | Status: DC
  Administered 2015-02-07 – 2015-02-10 (×4): 40 mg via ORAL
  Filled 2015-02-07 (×4): qty 1

## 2015-02-07 MED ORDER — AMIODARONE HCL 200 MG PO TABS
400.0000 mg | ORAL_TABLET | Freq: Two times a day (BID) | ORAL | Status: DC
  Administered 2015-02-07 – 2015-02-08 (×4): 400 mg via ORAL
  Filled 2015-02-07 (×6): qty 2

## 2015-02-07 MED ORDER — SODIUM CHLORIDE 0.9 % IJ SOLN
3.0000 mL | Freq: Two times a day (BID) | INTRAMUSCULAR | Status: DC
  Administered 2015-02-09 – 2015-02-10 (×3): 3 mL via INTRAVENOUS

## 2015-02-07 MED ORDER — SODIUM CHLORIDE 0.9 % IV SOLN: 250.0000 mL | INTRAVENOUS | Status: DC | PRN

## 2015-02-07 MED ORDER — SODIUM CHLORIDE 0.9 % IJ SOLN: 3.0000 mL | INTRAMUSCULAR | Status: DC | PRN

## 2015-02-07 MED ORDER — MOVING RIGHT ALONG BOOK
Freq: Once | Status: AC
  Administered 2015-02-07: 16:00:00
  Filled 2015-02-07: qty 1

## 2015-02-07 MED ORDER — GABAPENTIN 100 MG PO CAPS
200.0000 mg | ORAL_CAPSULE | Freq: Every day | ORAL | Status: DC
  Administered 2015-02-07 – 2015-02-09 (×3): 200 mg via ORAL
  Filled 2015-02-07 (×4): qty 2

## 2015-02-07 MED ORDER — GUAIFENESIN-DM 100-10 MG/5ML PO SYRP: 15.0000 mL | ORAL_SOLUTION | ORAL | Status: DC | PRN

## 2015-02-07 MED ORDER — TRAMADOL HCL 50 MG PO TABS: 50.0000 mg | ORAL_TABLET | ORAL | Status: DC | PRN

## 2015-02-07 MED ORDER — FE FUMARATE-B12-VIT C-FA-IFC PO CAPS
1.0000 | ORAL_CAPSULE | Freq: Three times a day (TID) | ORAL | Status: DC
  Administered 2015-02-07 – 2015-02-10 (×10): 1 via ORAL
  Filled 2015-02-07 (×13): qty 1

## 2015-02-07 NOTE — Progress Notes (Addendum)
PA Gina paged for clarification of chest tube order for suction at 20cm, reported from RN that patient is to be on water seal. Kathleen Argue S 6:07 PM   PA aware and acknowledged, will review orders. 6:09 PM   Per PA okay to leave patient's chest tube to water seal. 6:30 PM

## 2015-02-07 NOTE — Progress Notes (Signed)
3 Days Post-Op Procedure(s) (LRB): REDO CORONARY ARTERY BYPASS GRAFTING x two, using left leg greater saphenous vein harvested endoscopically.  (N/A) TRANSESOPHAGEAL ECHOCARDIOGRAM (TEE) (N/A) Subjective: Stable, progressing after redo CABG Will remove MT and R PT- leave L PT today  Objective: Vital signs in last 24 hours: Temp:  [97.5 F (36.4 C)-98.4 F (36.9 C)] 97.5 F (36.4 C) (06/16 0800) Pulse Rate:  [70-98] 81 (06/16 1000) Cardiac Rhythm:  [-] Normal sinus rhythm (06/16 0800) Resp:  [10-29] 20 (06/16 1000) BP: (82-110)/(55-79) 101/62 mmHg (06/16 1000) SpO2:  [91 %-98 %] 92 % (06/16 1000) Weight:  [221 lb 1.9 oz (100.3 kg)] 221 lb 1.9 oz (100.3 kg) (06/16 0600)  Hemodynamic parameters for last 24 hours:  nsr Off inotopes  Intake/Output from previous day: 06/15 0701 - 06/16 0700 In: 380.3 [I.V.:380.3] Out: 3410 [Urine:3200; Chest Tube:210] Intake/Output this shift: Total I/O In: 133.6 [I.V.:33.6; IV Piggyback:100] Out: 260 [Urine:150; Chest Tube:110]  Lungs clear Mild edema Neuro intact  Lab Results:  Recent Labs  02/06/15 0444 02/06/15 1652 02/07/15 0400  WBC 10.4  --  8.7  HGB 8.5* 10.5* 8.5*  HCT 24.5* 31.0* 25.0*  PLT 86*  --  101*   BMET:  Recent Labs  02/06/15 0444 02/06/15 1652 02/07/15 0400  NA 132* 130* 131*  K 4.3 4.5 4.0  CL 96* 90* 92*  CO2 31  --  33*  GLUCOSE 116* 126* 115*  BUN 16 22* 19  CREATININE 1.45* 1.50* 1.31*  CALCIUM 7.9*  --  8.1*    PT/INR:  Recent Labs  02/04/15 1608  LABPROT 17.9*  INR 1.47   ABG    Component Value Date/Time   PHART 7.372 02/05/2015 0447   HCO3 24.8* 02/05/2015 0447   TCO2 28 02/06/2015 1652   ACIDBASEDEF 1.0 02/05/2015 0447   O2SAT 68.3 02/07/2015 0509   CBG (last 3)   Recent Labs  02/06/15 2317 02/07/15 0356 02/07/15 0831  GLUCAP 102* 107* 117*    Assessment/Plan: S/P Procedure(s) (LRB): REDO CORONARY ARTERY BYPASS GRAFTING x two, using left leg greater saphenous vein  harvested endoscopically.  (N/A) TRANSESOPHAGEAL ECHOCARDIOGRAM (TEE) (N/A) Mobilize Diuresis Diabetes control Plan for transfer to step-down: see transfer orders   LOS: 10 days    Tharon Aquas Trigt III 02/07/2015

## 2015-02-07 NOTE — Plan of Care (Signed)
Problem: Phase II - Intermediate Post-Op Goal: Maintain Hemodynamic Stability Outcome: Progressing Pt remains on milrinone Goal: Pain controlled with appropriate interventions Outcome: Progressing Pt home po regimen restarted Goal: Advance Diet Outcome: Completed/Met Date Met:  02/07/15 cardiac Goal: Activity Progressed Outcome: Completed/Met Date Met:  02/07/15 Pt ambulates in hall

## 2015-02-08 ENCOUNTER — Inpatient Hospital Stay (HOSPITAL_COMMUNITY): Payer: Medicare Other

## 2015-02-08 LAB — CBC
HCT: 26 % — ABNORMAL LOW (ref 39.0–52.0)
Hemoglobin: 8.9 g/dL — ABNORMAL LOW (ref 13.0–17.0)
MCH: 30.8 pg (ref 26.0–34.0)
MCHC: 34.2 g/dL (ref 30.0–36.0)
MCV: 90 fL (ref 78.0–100.0)
Platelets: 115 10*3/uL — ABNORMAL LOW (ref 150–400)
RBC: 2.89 MIL/uL — ABNORMAL LOW (ref 4.22–5.81)
RDW: 13.6 % (ref 11.5–15.5)
WBC: 7 10*3/uL (ref 4.0–10.5)

## 2015-02-08 LAB — BASIC METABOLIC PANEL
Anion gap: 6 (ref 5–15)
BUN: 20 mg/dL (ref 6–20)
CO2: 33 mmol/L — ABNORMAL HIGH (ref 22–32)
Calcium: 8.5 mg/dL — ABNORMAL LOW (ref 8.9–10.3)
Chloride: 98 mmol/L — ABNORMAL LOW (ref 101–111)
Creatinine, Ser: 1.32 mg/dL — ABNORMAL HIGH (ref 0.61–1.24)
GFR calc Af Amer: >60 mL/min (ref >60)
GFR calc non Af Amer: 57 mL/min — ABNORMAL LOW (ref >60)
Glucose, Bld: 101 mg/dL — ABNORMAL HIGH (ref 65–99)
Potassium: 4.1 mmol/L (ref 3.5–5.1)
Sodium: 137 mmol/L (ref 135–145)

## 2015-02-08 LAB — GLUCOSE, CAPILLARY
Glucose-Capillary: 94 mg/dL (ref 65–99)
Glucose-Capillary: 123 mg/dL — ABNORMAL HIGH (ref 65–99)
Glucose-Capillary: 86 mg/dL (ref 65–99)
Glucose-Capillary: 109 mg/dL — ABNORMAL HIGH (ref 65–99)

## 2015-02-08 MED ORDER — LACTULOSE 10 GM/15ML PO SOLN
20.0000 g | Freq: Every day | ORAL | Status: DC | PRN
  Filled 2015-02-08: qty 30

## 2015-02-08 NOTE — Progress Notes (Signed)
Chest tube removed 10:31 AM   EPW removed, pt on bedrest for 1 hour. 10:32 AM   Sherrie Mustache

## 2015-02-08 NOTE — Discharge Summary (Signed)
Physician Discharge Summary  Patient ID: Taha Dimond MRN: [id number] DOB/AGE: Sep 14, 1955 59 y.o.  Admit date: 01/28/2015 Discharge date: 02/10/2015  Admission Diagnoses:  Patient Active Problem List   Diagnosis Date Noted  . NSTEMI (non-ST elevated myocardial infarction) 01/28/2015  . Chest pain 01/28/2015  . Coronary artery disease 01/28/2015  . Dyslipidemia 01/28/2015  . Tobacco abuse 01/28/2015   Discharge Diagnoses:   Patient Active Problem List   Diagnosis Date Noted  . S/P CABG x 2 02/04/2015  . NSTEMI (non-ST elevated myocardial infarction) 01/28/2015  . Chest pain 01/28/2015  . Coronary artery disease 01/28/2015  . Dyslipidemia 01/28/2015  . Tobacco abuse 01/28/2015   Discharged Condition: good  History of Present Illness:   Mr. Hanley is a 59 yo white male with known history of CAD.  He is S/P CABG performed 10 years ago in [place].  He presented to an OSH ED with complaints of chest pain that started while he was playing golf.  This was associated with diaphoresis.  The pain was on his left side and radiated into his left arm.  He was evaluated and it was recommended he undergo cardiac catheterization.  He wished for this to be done at [facility] and the patient was transferred here.  Workup at [facility] hospital confirmed NSTEMI with elevated Troponin level. He was started on Heparin drip.    Hospital Course:   The patient was taken for cardiac catheterization which revealed 3 vessel CAD with occluded vein grafts to OM and Diagonal.  His LIMA to LAD was patent and his EF was preserved.  It was felt he would require possible Redo CABG and TCTS consult was placed.  He was evaluated by Dr. Prescott Gum who was in agreement the patient would require redo CABG procedure.  The risks and benefits of the procedure were explained to the patient and he was agreeable to proceed.  He was taken to the operating room on 02/04/2015.  He underwent CABG x 2 utilizing SVG to OM  and SVG to PDA.  He also underwent Endoscopic harvest of the greater saphenous vein from the left leg.  He tolerated the procedure without difficulty and was taken to the SICU in stable condition.  He was extubated the evening of surgery.  During his stay in the SICU the patient was weaned off Milrinone, Epinphrine and Norepinephrine as tolerated.  He was hypervolemic and started on a Lasix gtt.  The patient was maintaining NSR.  His mediastinal and right pleural chest tube was removed without difficulty.  He was felt medically stable for transfer on POD #2.  The patient continues to progress.  His left pleural chest tube was removed without difficulty on POD #4.  He continues to maintain NSR and his pacing wires were removed without difficulty.  He is tolerating a cardiac diet.  He is ambulating without difficulty.  His BP has remained labile in the low 90's .He is not symptomatic His heart rate is mostly in the 70's and so his Amiodarone was decreased to 200 mg daily. As a result, lopressor was stopped and he was started on Coreg 3.125 mg daily. He did complain of "clogged ears" and was given Cortisporin drops. He is felt surgically stable for discharge today.         Significant Diagnostic Studies: angiography:    Mid LAD lesion, 100% stenosed.  1st Diag-2 lesion, 100% stenosed.  1st Diag-1 lesion, 90% stenosed.  Ramus lesion, 95% stenosed.  Prox Cx lesion,  70% stenosed.  Prox Cx to Dist Cx lesion, 100% stenosed.  Prox RCA to Mid RCA lesion, 100% stenosed.  Dist RCA lesion, 100% stenosed.  Origin lesion, before 1st Mrg, 100% stenosed.  Prox Graft lesion, 100% stenosed.  Prox Graft to Mid Graft lesion, 95% stenosed.  Mid Graft to Dist Graft lesion, 95% stenosed.  Dist LAD lesion, 60% stenosed.  1. Severe 3 vessel occlusive CAD 2. Patent LIMA to the LAD 3. SVG to PDA is diffusely and severely diseased with extensive ectasia and thrombosis 4. Occluded SVG to OM 5. Occluded SVG to  diagonal 6. Normal LV EDP.  Treatments: surgery:   1. Redo coronary artery bypass grafting x2 (saphenous vein graft to  posterior descending, saphenous vein graft to optional diagonal). 2. Left leg endoscopic saphenous vein harvest. 3. Placement of femoral A-line for blood pressure monitoring.  Disposition: Home  Discharge Medications:   Medication List    TAKE these medications        amiodarone 200 MG tablet  Commonly known as:  PACERONE  Take 1 tablet (200 mg total) by mouth daily.     aspirin 325 MG EC tablet  Take 1 tablet (325 mg total) by mouth daily.  Start taking on:  02/11/2015     atorvastatin 80 MG tablet  Commonly known as:  LIPITOR  Take 1 tablet (80 mg total) by mouth daily at 6 PM.     carvedilol 3.125 MG tablet  Commonly known as:  COREG  Take 1 tablet (3.125 mg total) by mouth daily.     clotrimazole-betamethasone cream  Commonly known as:  LOTRISONE  Apply 1 application topically 2 (two) times daily.     ferrous [id number] C-folic acid capsule  Commonly known as:  TRINSICON / FOLTRIN  Take 1 capsule by mouth 3 (three) times daily after meals. For one month then stop.     fluticasone 50 MCG/ACT nasal spray  Commonly known as:  FLONASE  Place 1 spray into both nostrils daily.     furosemide 20 MG tablet  Commonly known as:  LASIX  Take 1 tablet (20 mg total) by mouth daily. For 5 days then stop.     gabapentin 300 MG capsule  Commonly known as:  NEURONTIN  Take 300 mg by mouth at bedtime.     HYDROcodone-acetaminophen 10-325 MG per tablet  Commonly known as:  NORCO  Take 1 tablet by mouth every 6 (six) hours as needed for severe pain.     NEOMYCIN-POLYMYXIN-HYDROCORTISONE 1 % Soln otic solution  Commonly known as:  CORTISPORIN  Place 2 drops into both ears every 6 (six) hours.     potassium chloride 10 MEQ tablet  Commonly known as:  K-DUR,KLOR-CON  Take 1 tablet (10 mEq total) by mouth daily. For 5 days then stop.      Riboflavin-Magnesium-Feverfew 200-180-50 MG Tabs  Take 1 tablet by mouth 2 (two) times daily.     tiZANidine 4 MG tablet  Commonly known as:  ZANAFLEX  Take 4 mg by mouth every 8 (eight) hours as needed for muscle spasms.     zolpidem 10 MG tablet  Commonly known as:  AMBIEN  Take 10 mg by mouth at bedtime.       The patient has been discharged on:   1.Beta Blocker:  Yes [  x ]  No   [   ]                              If No, reason:  2.Ace Inhibitor/ARB: Yes [   ]                                     No  [x    ]                                     If No, reason: labile blood pressure  3.Statin:   Yes [ x  ]                  No  [   ]                  If No, reason:  4.Ecasa:  Yes  [ x  ]                  No   [   ]                  If No, reason:       Medication List    TAKE these medications        amiodarone 200 MG tablet  Commonly known as:  PACERONE  Take 1 tablet (200 mg total) by mouth daily.     aspirin 325 MG EC tablet  Take 1 tablet (325 mg total) by mouth daily.  Start taking on:  02/11/2015     atorvastatin 80 MG tablet  Commonly known as:  LIPITOR  Take 1 tablet (80 mg total) by mouth daily at 6 PM.     carvedilol 3.125 MG tablet  Commonly known as:  COREG  Take 1 tablet (3.125 mg total) by mouth daily.     clotrimazole-betamethasone cream  Commonly known as:  LOTRISONE  Apply 1 application topically 2 (two) times daily.     ferrous [id number] C-folic acid capsule  Commonly known as:  TRINSICON / FOLTRIN  Take 1 capsule by mouth 3 (three) times daily after meals. For one month then stop.     fluticasone 50 MCG/ACT nasal spray  Commonly known as:  FLONASE  Place 1 spray into both nostrils daily.     furosemide 20 MG tablet  Commonly known as:  LASIX  Take 1 tablet (20 mg total) by mouth daily. For 5 days then stop.     gabapentin 300 MG capsule  Commonly known as:  NEURONTIN  Take 300 mg by mouth  at bedtime.     HYDROcodone-acetaminophen 10-325 MG per tablet  Commonly known as:  NORCO  Take 1 tablet by mouth every 6 (six) hours as needed for severe pain.     NEOMYCIN-POLYMYXIN-HYDROCORTISONE 1 % Soln otic solution  Commonly known as:  CORTISPORIN  Place 2 drops into both ears every 6 (six) hours.     potassium chloride 10 MEQ tablet  Commonly known as:  K-DUR,KLOR-CON  Take 1 tablet (10 mEq total) by mouth daily. For 5 days then stop.     Riboflavin-Magnesium-Feverfew 200-180-50 MG Tabs  Take 1 tablet by mouth 2 (two) times daily.     tiZANidine 4 MG tablet  Commonly known  as:  ZANAFLEX  Take 4 mg by mouth every 8 (eight) hours as needed for muscle spasms.     zolpidem 10 MG tablet  Commonly known as:  AMBIEN  Take 10 mg by mouth at bedtime.       Follow-up Information    Follow up with Ivin Poot III, MD On 03/06/2015.   Specialty:  Cardiothoracic Surgery   Why:  Appointment is at 2:00 pm   Contact information:   [address] [place] [address] [phone]       Follow up with [facility] On 03/06/2015.   Why:  please get CXR at 1:30   Contact information:   [facility]       Follow up with Candee Furbish, MD.   Specialty:  Cardiology   Why:  Call for a follow up for 2 weeks   Contact information:   [address] [place] [address] [phone]       Signed: Nani Skillern 02/10/2015, 11:57 AM

## 2015-02-08 NOTE — Progress Notes (Signed)
Utilization review completed.  

## 2015-02-08 NOTE — Progress Notes (Addendum)
      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  (N/A) TRANSESOPHAGEAL ECHOCARDIOGRAM (TEE) (N/A)   Subjective:  [name] states he is doing okay.  He states he did not rest well last night.  He has not ambulated.  Objective: Vital signs in last 24 hours: Temp:  [97.5 F (36.4 C)-98.6 F (37 C)] 98.5 F (36.9 C) (06/17 0501) Pulse Rate:  [69-96] 72 (06/17 0501) Cardiac Rhythm:  [-] Normal sinus rhythm (06/16 2216) Resp:  [12-21] 18 (06/17 0501) BP: (93-115)/(62-83) 93/62 mmHg (06/17 0501) SpO2:  [92 %-100 %] 94 % (06/17 0501) Weight:  [217 lb 6.4 oz (98.612 kg)] 217 lb 6.4 oz (98.612 kg) (06/17 0501)  Intake/Output from previous day: 06/16 0701 - 06/17 0700 In: 613.6 [P.O.:480; I.V.:33.6; IV Piggyback:100] Out: 2620 [Urine:2480; Chest Tube:140] Intake/Output this shift: Total I/O In: -  Out: 50 [Chest Tube:50]  General appearance: alert, cooperative and no distress Heart: regular rate and rhythm Lungs: clear to auscultation bilaterally Abdomen: soft, non-tender; bowel sounds normal; no masses,  no organomegaly Extremities: edema trace Wound: clean and dry  Lab Results:  Recent Labs  02/07/15 0400 02/08/15 0320  WBC 8.7 7.0  HGB 8.5* 8.9*  HCT 25.0* 26.0*  PLT 101* 115*   BMET:  Recent Labs  02/07/15 0400 02/08/15 0320  NA 131* 137  K 4.0 4.1  CL 92* 98*  CO2 33* 33*  GLUCOSE 115* 101*  BUN 19 20  CREATININE 1.31* 1.32*  CALCIUM 8.1* 8.5*    PT/INR: No results for input(s): LABPROT, INR in the last 72 hours. 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  (N/A) TRANSESOPHAGEAL ECHOCARDIOGRAM (TEE) (N/A)  1. CV- NSR, labile blood pressure-several doses of Lopressor have been held, is on Amiodarone- will likely not need beta blocker at discharge 2. Pulm- no acute issues, off oxygen, continue IS 3. Renal- creatinine remains stable, mild hypervolemia continue Lasix 4. Chest tube-output 50 cc since midnight, will remove today 5. Dispo- patient stable, will d/c EPW, d/c chest tube, if continues to improve likely d/c in next 24-48 hours   LOS: 11 days    [name] 02/08/2015

## 2015-02-08 NOTE — Progress Notes (Signed)
CARDIAC REHAB PHASE I   PRE:  Rate/Rhythm: 83 SR  BP:  Sitting: 90/57        SaO2: 95 RA  MODE:  Ambulation: 775 ft   POST:  Rate/Rhythm: 102 ST  BP:  Sitting: 110/79         SaO2: 97 RA  Pt BP low this morning. Pt states it has been low, he reports feeling tired,denies any other symptoms, RN aware with plans to hold metoprolol. Pt ambulated 775 ft on RA, rolling walker, chest tube, standby assist, tolerated well. Pt denies CP, dizziness, DOE. Pt very motivated to ambulate, states he wants to go home soon. Pt to recliner after walk, call bell within reach, wife at bedside.   [phone]  Lenna Sciara, RN, BSN 02/08/2015 10:07 AM

## 2015-02-08 NOTE — Discharge Instructions (Signed)
Coronary Artery Bypass Grafting, Care After °Refer to this sheet in the next few weeks. These instructions provide you with information on caring for yourself after your procedure. Your health care provider may also give you more specific instructions. Your treatment has been planned according to current medical practices, but problems sometimes occur. Call your health care provider if you have any problems or questions after your procedure. °WHAT TO EXPECT AFTER THE PROCEDURE °Recovery from surgery will be different for everyone. Some people feel well after 3 or 4 weeks, while for others it takes longer. After your procedure, it is typical to have the following: °· Nausea and a lack of appetite.   °· Constipation. °· Weakness and fatigue.   °· Depression or irritability.   °· Pain or discomfort at your incision site. °HOME CARE INSTRUCTIONS °· Take medicines only as directed by your health care provider. Do not stop taking medicines or start any new medicines without first checking with your health care provider. °· Take your pulse as directed by your health care provider. °· Perform deep breathing as directed by your health care provider. If you were given a device called an incentive spirometer, use it to practice deep breathing several times a day. Support your chest with a pillow or your arms when you take deep breaths or cough. °· Keep incision areas clean, dry, and protected. Remove or change any bandages (dressings) only as directed by your health care provider. You may have skin adhesive strips over the incision areas. Do not take the strips off. They will fall off on their own. °· Check incision areas daily for any swelling, redness, or drainage. °· If incisions were made in your legs, do the following: °¨ Avoid crossing your legs.   °¨ Avoid sitting for long periods of time. Change positions every 30 minutes.   °¨ Elevate your legs when you are sitting. °· Wear compression stockings as directed by your  health care provider. These stockings help keep blood clots from forming in your legs. °· Take showers once your health care provider approves. Until then, only take sponge baths. Pat incisions dry. Do not rub incisions with a washcloth or towel. Do not take baths, swim, or use a hot tub until your health care provider approves. °· Eat foods that are high in fiber, such as raw fruits and vegetables, whole grains, beans, and nuts. Meats should be lean cut. Avoid canned, processed, and fried foods. °· Drink enough fluid to keep your urine clear or pale yellow. °· Weigh yourself every day. This helps identify if you are retaining fluid that may make your heart and lungs work harder. °· Rest and limit activity as directed by your health care provider. You may be instructed to: °¨ Stop any activity at once if you have chest pain, shortness of breath, irregular heartbeats, or dizziness. Get help right away if you have any of these symptoms. °¨ Move around frequently for short periods or take short walks as directed by your health care provider. Increase your activities gradually. You may need physical therapy or cardiac rehabilitation to help strengthen your muscles and build your endurance. °¨ Avoid lifting, pushing, or pulling anything heavier than 10 lb (4.5 kg) for at least 6 weeks after surgery. °· Do not drive until your health care provider approves.  °· Ask your health care provider when you may return to work. °· Ask your health care provider when you may resume sexual activity. °· Keep all follow-up visits as directed by your health care   provider. This is important. °SEEK MEDICAL CARE IF: °· You have swelling, redness, increasing pain, or drainage at the site of an incision. °· You have a fever. °· You have swelling in your ankles or legs. °· You have pain in your legs.   °· You gain 2 or more pounds (0.9 kg) a day. °· You are nauseous or vomit. °· You have diarrhea.  °SEEK IMMEDIATE MEDICAL CARE IF: °· You have  chest pain that goes to your jaw or arms. °· You have shortness of breath.   °· You have a fast or irregular heartbeat.   °· You notice a "clicking" in your breastbone (sternum) when you move.   °· You have numbness or weakness in your arms or legs. °· You feel dizzy or light-headed.   °MAKE SURE YOU: °· Understand these instructions. °· Will watch your condition. °· Will get help right away if you are not doing well or get worse. °Document Released: 02/27/2005 Document Revised: 12/25/2013 Document Reviewed: 01/17/2013 °[employer]® Patient Information ©2015 [employer]. This information is not intended to replace advice given to you by your health care provider. Make sure you discuss any questions you have with your health care provider. ° °Endoscopic Saphenous Vein Harvesting °Care After °Refer to this sheet in the next few weeks. These instructions provide you with information on caring for yourself after your procedure. Your health care provider may also give you more specific instructions. Your treatment has been planned according to current medical practices, but problems sometimes occur. Call your health care provider if you have any problems or questions after your procedure. °HOME CARE INSTRUCTIONS °Medicine °· Take whatever pain medicine your surgeon prescribes. Follow the directions carefully. Do not take over-the-counter pain medicine unless your surgeon says it is okay. Some pain medicine can cause bleeding problems for several weeks after surgery. °· Follow your surgeon's instructions about driving. You will probably not be permitted to drive after heart surgery. °· Take any medicines your surgeon prescribes. Any medicines you took before your heart surgery should be checked with your health care provider before you start taking them again. °Wound care °· If your surgeon has prescribed an elastic bandage or stocking, ask how long you should wear it. °· Check the area around your surgical cuts  (incisions) whenever your bandages (dressings) are changed. Look for any redness or swelling. °· You will need to return to have the stitches (sutures) or staples taken out. Ask your surgeon when to do that. °· Ask your surgeon when you can shower or bathe. °Activity °· Try to keep your legs raised when you are sitting. °· Do any exercises your health care providers have given you. These may include deep breathing exercises, coughing, walking, or other exercises. °SEEK MEDICAL CARE IF: °· You have any questions about your medicines. °· You have more leg pain, especially if your pain medicine stops working. °· New or growing bruises develop on your leg. °· Your leg swells, feels tight, or becomes red. °· You have numbness in your leg. °SEEK IMMEDIATE MEDICAL CARE IF: °· Your pain gets much worse. °· Blood or fluid leaks from any of the incisions. °· Your incisions become warm, swollen, or red. °· You have chest pain. °· You have trouble breathing. °· You have a fever. °· You have more pain near your leg incision. °MAKE SURE YOU: °· Understand these instructions. °· Will watch your condition. °· Will get help right away if you are not doing well or get worse. °Document Released: 04/22/2011   Document Revised: 08/15/2013 Document Reviewed: 04/22/2011 °Exit[employer]® Patient Information ©2015 [employer]. This information is not intended to replace advice given to you by your health care provider. Make sure you discuss any questions you have with your health care provider. ° ° °

## 2015-02-09 ENCOUNTER — Inpatient Hospital Stay (HOSPITAL_COMMUNITY): Payer: Medicare Other

## 2015-02-09 LAB — GLUCOSE, CAPILLARY: Glucose-Capillary: 98 mg/dL (ref 65–99)

## 2015-02-09 MED ORDER — CARVEDILOL 3.125 MG PO TABS
3.1250 mg | ORAL_TABLET | Freq: Every day | ORAL | Status: DC
  Administered 2015-02-09 – 2015-02-10 (×2): 3.125 mg via ORAL
  Filled 2015-02-09 (×2): qty 1

## 2015-02-09 MED ORDER — AMIODARONE HCL 200 MG PO TABS
200.0000 mg | ORAL_TABLET | Freq: Two times a day (BID) | ORAL | Status: DC
  Administered 2015-02-09 – 2015-02-10 (×3): 200 mg via ORAL
  Filled 2015-02-09 (×4): qty 1

## 2015-02-09 NOTE — Progress Notes (Signed)
Patient ambulated X2 on unit unassisted on room air.  Patient tolerated well.  RN will continue to monitor patient.

## 2015-02-09 NOTE — Progress Notes (Signed)
CARDIAC REHAB PHASE I   PRE:  Rate/Rhythm: 106 sinus tach  BP:  Supine:   Sitting: 109/75  Standing:    SaO2: 97% RA  MODE:  Ambulation: 890 ft   POST:  Rate/Rhythem: 110 sinus tach  BP:  Supine:   Sitting: 117/75  Standing:    SaO2: 97-100% RA  Pt ambulated 890 ft with assist x1.  Pt tolerated walk well and has been walking some independently.  Wife had just arrived for day.  Reviewed d/c education with pt and wife.  Discussed restrictions, sternal precautions, incisional care, diet, exercise, ISP use, and smoking cessation.  Also discussed Cardiac Rehab Phase II. Pt declined rehab during last event, but was open to going this go around.  Will send info to [place], [place] for rehab.  Pt and wife voiced understanding.  Pt was encouraged to continue to walk on his own or with family while here.   Alberteen Sam, MA, ACSM RCEP [phone]  Clotilde Dieter

## 2015-02-09 NOTE — Progress Notes (Addendum)
      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  (N/A) TRANSESOPHAGEAL ECHOCARDIOGRAM (TEE) (N/A)  Subjective: Patient without complaints. Has had bowel movement.  Objective: Vital signs in last 24 hours: Temp:  [98.2 F (36.8 C)-98.3 F (36.8 C)] 98.3 F (36.8 C) (06/18 0529) Pulse Rate:  [72-101] 76 (06/18 0529) Cardiac Rhythm:  [-] Normal sinus rhythm (06/18 0807) Resp:  [12-16] 16 (06/18 0529) BP: (90-104)/(57-75) 94/63 mmHg (06/18 0529) SpO2:  [90 %-94 %] 90 % (06/18 0529) Weight:  [214 lb 12.8 oz (97.433 kg)] 214 lb 12.8 oz (97.433 kg) (06/18 0529)  Pre op weight 94.7 kg Current Weight  02/09/15 214 lb 12.8 oz (97.433 kg)      Intake/Output from previous day: 06/17 0701 - 06/18 0700 In: 720 [P.O.:720] Out: 302 [Urine:251; Stool:1; Chest Tube:50]   Physical Exam:  Cardiovascular: RRR Pulmonary: Clear to auscultation bilaterally; no rales, wheezes, or rhonchi. Abdomen: Soft, non tender, bowel sounds present. Extremities: Mild bilateral lower extremity edema. Wounds: Clean and dry.  No erythema or signs of infection.  Lab Results: CBC: Recent Labs  02/07/15 0400 02/08/15 0320  WBC 8.7 7.0  HGB 8.5* 8.9*  HCT 25.0* 26.0*  PLT 101* 115*   BMET:  Recent Labs  02/07/15 0400 02/08/15 0320  NA 131* 137  K 4.0 4.1  CL 92* 98*  CO2 33* 33*  GLUCOSE 115* 101*  BUN 19 20  CREATININE 1.31* 1.32*  CALCIUM 8.1* 8.5*    PT/INR:  Lab Results  Component Value Date   INR 1.47 02/04/2015   INR 1.22 01/29/2015   ABG:  INR: Will add last result for INR, ABG once components are confirmed Will add last 4 CBG results once components are confirmed  Assessment/Plan:  1. CV - Previous a fib. SR in the 70-80's, SBP in the 90's. On Amiodarone 400 mg bid and Lopressor  12.5 mg bid. Will decrease Amidoarone. Lopressor not regularly being given secondary to labile BP. As discussed with Dr. Prescott Gum, stop Lopressor and start low dose daily Coreg. 2.  Pulmonary - On room air. Encourage incentive spirometer. 3. Volume Overload - On Lasix 40 mg daily 4.  Acute blood loss anemia - Last H and H stable at 8.9 and 26. Continue Trinsicon 5. Mild thrombocytopenia-last platelets 115,000 6. Last creatinine stable at 1.32 7. CBGs 86/109/98. Pre op HGA1C 5.9. 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 Will stop accu checks and SS PRN 8. Remove sutures in am 9. Possibly discharge in am   ZIMMERMAN,DONIELLE MPA-C 02/09/2015,8:41 AM  patient examined and medical record reviewed,agree with above note. Tharon Aquas Trigt III 02/09/2015

## 2015-02-10 MED ORDER — POTASSIUM CHLORIDE CRYS ER 20 MEQ PO TBCR: 20.0000 meq | EXTENDED_RELEASE_TABLET | Freq: Every day | ORAL | Status: DC

## 2015-02-10 MED ORDER — NEOMYCIN-POLYMYXIN-HC 1 % OT SOLN
2.0000 [drp] | Freq: Four times a day (QID) | OTIC | Status: DC
Start: 2015-02-10 — End: 2015-02-28

## 2015-02-10 MED ORDER — AMIODARONE HCL 200 MG PO TABS: 200.0000 mg | ORAL_TABLET | Freq: Every day | ORAL | Status: DC

## 2015-02-10 MED ORDER — CARVEDILOL 3.125 MG PO TABS: 3.1250 mg | ORAL_TABLET | Freq: Every day | ORAL | Status: DC

## 2015-02-10 MED ORDER — HYDROCODONE-ACETAMINOPHEN 10-325 MG PO TABS: 1.0000 | ORAL_TABLET | Freq: Four times a day (QID) | ORAL | Status: AC | PRN

## 2015-02-10 MED ORDER — FUROSEMIDE 40 MG PO TABS: 40.0000 mg | ORAL_TABLET | Freq: Every day | ORAL | Status: DC

## 2015-02-10 MED ORDER — AMIODARONE HCL 200 MG PO TABS
200.0000 mg | ORAL_TABLET | Freq: Two times a day (BID) | ORAL | Status: DC
Start: 2015-02-10 — End: 2015-02-10

## 2015-02-10 MED ORDER — ATORVASTATIN CALCIUM 80 MG PO TABS: 80.0000 mg | ORAL_TABLET | Freq: Every day | ORAL | Status: DC

## 2015-02-10 MED ORDER — ASPIRIN EC 325 MG PO TBEC: 325.0000 mg | DELAYED_RELEASE_TABLET | Freq: Every day | ORAL | Status: DC

## 2015-02-10 MED ORDER — NEOMYCIN-POLYMYXIN-HC 1 % OT SOLN
2.0000 [drp] | Freq: Four times a day (QID) | OTIC | Status: DC
  Filled 2015-02-10: qty 10

## 2015-02-10 MED ORDER — ASPIRIN 325 MG PO TBEC: 325.0000 mg | DELAYED_RELEASE_TABLET | Freq: Every day | ORAL | Status: DC

## 2015-02-10 MED ORDER — POTASSIUM CHLORIDE CRYS ER 10 MEQ PO TBCR: 10.0000 meq | EXTENDED_RELEASE_TABLET | Freq: Every day | ORAL | Status: DC

## 2015-02-10 MED ORDER — ASPIRIN 81 MG PO TBEC: 81.0000 mg | DELAYED_RELEASE_TABLET | Freq: Every day | ORAL | Status: DC

## 2015-02-10 MED ORDER — FUROSEMIDE 20 MG PO TABS: 20.0000 mg | ORAL_TABLET | Freq: Every day | ORAL | Status: DC

## 2015-02-10 MED ORDER — POTASSIUM CHLORIDE CRYS ER 20 MEQ PO TBCR
20.0000 meq | EXTENDED_RELEASE_TABLET | Freq: Every day | ORAL | Status: DC
  Administered 2015-02-10: 20 meq via ORAL
  Filled 2015-02-10: qty 1

## 2015-02-10 MED ORDER — FE FUMARATE-B12-VIT C-FA-IFC PO CAPS: 1.0000 | ORAL_CAPSULE | Freq: Three times a day (TID) | ORAL | Status: DC

## 2015-02-10 NOTE — Progress Notes (Addendum)
      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  (N/A) TRANSESOPHAGEAL ECHOCARDIOGRAM (TEE) (N/A)  Subjective: Patient has complaints of "clogged ears"  Objective: Vital signs in last 24 hours: Temp:  [97.9 F (36.6 C)-98.5 F (36.9 C)] 98 F (36.7 C) (06/19 0529) Pulse Rate:  [61-73] 61 (06/19 0529) Cardiac Rhythm:  [-] Normal sinus rhythm (06/18 0807) Resp:  [16-18] 18 (06/19 0529) BP: (90-97)/(60-68) 91/60 mmHg (06/19 0529) SpO2:  [92 %-96 %] 93 % (06/19 0529) Weight:  [213 lb 9.6 oz (96.888 kg)] 213 lb 9.6 oz (96.888 kg) (06/19 0529)  Pre op weight 94.7 kg Current Weight  02/10/15 213 lb 9.6 oz (96.888 kg)      Intake/Output from previous day: 06/18 0701 - 06/19 0700 In: 240 [P.O.:240] Out: -    Physical Exam:  Cardiovascular: RRR Pulmonary: Clear to auscultation bilaterally; no rales, wheezes, or rhonchi. Abdomen: Soft, non tender, bowel sounds present. Extremities: Mild bilateral lower extremity edema. Wounds: Clean and dry.  No erythema or signs of infection.  Lab Results: CBC:  Recent Labs  02/08/15 0320  WBC 7.0  HGB 8.9*  HCT 26.0*  PLT 115*   BMET:   Recent Labs  02/08/15 0320  NA 137  K 4.1  CL 98*  CO2 33*  GLUCOSE 101*  BUN 20  CREATININE 1.32*  CALCIUM 8.5*    PT/INR:  Lab Results  Component Value Date   INR 1.47 02/04/2015   INR 1.22 01/29/2015   ABG:  INR: Will add last result for INR, ABG once components are confirmed Will add last 4 CBG results once components are confirmed  Assessment/Plan:  1. CV - Previous a fib. SR in the 70's this am. SBP remains in the 90's. He is not symptomatic. On Amiodarone 200 mg bid and Coreg 3.125 mg daily. Will decrease Amiodarone to 200 mg daily. 2.  Pulmonary - On room air. Encourage incentive  spirometer. 3. Volume Overload -  Per Dr. Prescott Gum, Lasix 20 mg daily  4.  Acute blood loss anemia - Last H and H stable at 8.9 and 26. Continue Trinsicon 5. Mild thrombocytopenia-last platelets 115,000 6. Last creatinine stable at 1.32 8. Remove sutures 9. Cortisporin drops for ears 9. Per Dr. Prescott Gum, discharge today   ZIMMERMAN,DONIELLE MPA-C 02/10/2015,7:28 AM   patient examined and medical record reviewed,agree with above note. Tharon Aquas Trigt III 02/10/2015

## 2015-02-28 ENCOUNTER — Ambulatory Visit (INDEPENDENT_AMBULATORY_CARE_PROVIDER_SITE_OTHER): Payer: Medicare Other | Admitting: Cardiology

## 2015-02-28 ENCOUNTER — Encounter: Payer: Self-pay | Admitting: Cardiology

## 2015-02-28 VITALS — BP 98/67 | HR 66 | Ht 74.0 in | Wt 213.1 lb

## 2015-02-28 DIAGNOSIS — I2510 Atherosclerotic heart disease of native coronary artery without angina pectoris: Secondary | ICD-10-CM | POA: Diagnosis not present

## 2015-02-28 DIAGNOSIS — Z951 Presence of aortocoronary bypass graft: Secondary | ICD-10-CM

## 2015-02-28 MED ORDER — FE FUMARATE-B12-VIT C-FA-IFC PO CAPS: 1.0000 | ORAL_CAPSULE | Freq: Three times a day (TID) | ORAL | Status: DC

## 2015-02-28 MED ORDER — ASPIRIN EC 81 MG PO TBEC: 81.0000 mg | DELAYED_RELEASE_TABLET | Freq: Every day | ORAL | Status: DC

## 2015-02-28 NOTE — Patient Instructions (Signed)
Your physician has recommended you make the following change in your medication:  Stop amiodarone. Decrease aspirin to 81 mg daily. Continue your iron pills three times daily for 3 weeks; then, stop them. Continue all other medications the same. Your physician recommends that you schedule a follow-up appointment in: 3 months. You will receive a reminder letter in the mail in about 1-2 months reminding you to call and schedule your appointment. If you don't receive this letter, please contact our office.

## 2015-02-28 NOTE — Progress Notes (Signed)
Clinical Summary Mr. Seidman is a 59 y.o.male seen today as a hospital follow up appointment, this is our first visit together.  1. CAD - history of CABG 10 years ago in [place]. Recent admit 01/2015 at [facility] with chest pain for NSTEMI. Repeat CABG with SVG-OM and SVG-PDA -echo 01/2015 LVEF 55-60%  - notes some chest soreness worst with movement and with position. No SOB or DOE. - compliant with meds   2. Postop afib - was treated with amio postoperatively - denies any recent palpitaitons  Past Medical History  Diagnosis Date  . Brain tumor     Clival Chordoma   . S/P CABG x 4     2006  . Myocardial infarction   . Diverticulitis large intestine   . Ventral hernia   . Chordoma of clivus   . Single kidney     sinus dis  . Sinus disease   . Myalgia and myositis   . Glucocorticoid deficiency   . H/O sinus surgery   . Coronary artery disease   . Dyspnea   . Fatigue   . Memory loss      Allergies  Allergen Reactions  . Tape Rash  . Depakote Er [Divalproex Sodium Er] Other (See Comments)    Not effective     Current Outpatient Prescriptions  Medication Sig Dispense Refill  . amiodarone (PACERONE) 200 MG tablet Take 1 tablet (200 mg total) by mouth daily. 30 tablet 1  . aspirin EC 325 MG EC tablet Take 1 tablet (325 mg total) by mouth daily. 30 tablet 0  . atorvastatin (LIPITOR) 80 MG tablet Take 1 tablet (80 mg total) by mouth daily at 6 PM. 30 tablet 1  . carvedilol (COREG) 3.125 MG tablet Take 1 tablet (3.125 mg total) by mouth daily. 30 tablet 1  . clotrimazole-betamethasone (LOTRISONE) cream Apply 1 application topically 2 (two) times daily.    . ferrous [id number] C-folic acid (TRINSICON / FOLTRIN) capsule Take 1 capsule by mouth 3 (three) times daily after meals. For one month then stop. 30 capsule 0  . fluticasone (FLONASE) 50 MCG/ACT nasal spray Place 1 spray into both nostrils daily.    . furosemide (LASIX) 20 MG tablet Take 1 tablet  (20 mg total) by mouth daily. For 5 days then stop. 5 tablet 0  . gabapentin (NEURONTIN) 300 MG capsule Take 300 mg by mouth at bedtime.    Marland Kitchen HYDROcodone-acetaminophen (NORCO) 10-325 MG per tablet Take 1 tablet by mouth every 6 (six) hours as needed for severe pain. 30 tablet 0  . NEOMYCIN-POLYMYXIN-HYDROCORTISONE (CORTISPORIN) 1 % SOLN otic solution Place 2 drops into both ears every 6 (six) hours. 10 mL 0  . potassium chloride SA (K-DUR,KLOR-CON) 10 MEQ tablet Take 1 tablet (10 mEq total) by mouth daily. For 5 days then stop. 5 tablet 0  . Riboflavin-Magnesium-Feverfew 200-180-50 MG TABS Take 1 tablet by mouth 2 (two) times daily.    Marland Kitchen tiZANidine (ZANAFLEX) 4 MG tablet Take 4 mg by mouth every 8 (eight) hours as needed for muscle spasms.    Marland Kitchen zolpidem (AMBIEN) 10 MG tablet Take 10 mg by mouth at bedtime.      No current facility-administered medications for this visit.     Past Surgical History  Procedure Laterality Date  . Coronary artery bypass graft    . Cardiac catheterization    . Brain surgery    . Hernia repair    . Fracture surgery    .  Cardiac catheterization  01/29/2015    Procedure: Left Heart Cath and Cors/Grafts Angiography;  Surgeon: Peter M Martinique, MD;  Location: [place] [facility];  Service: Cardiovascular;;  . Colostomy    . Coronary artery bypass graft    . Coronary artery bypass graft N/A 02/04/2015    Procedure: REDO CORONARY ARTERY BYPASS GRAFTING x two, using left leg greater saphenous vein harvested endoscopically. ;  Surgeon: Ivin Poot, MD;  Location: [place];  Service: Open Heart Surgery;  Laterality: N/A;  . Tee without cardioversion N/A 02/04/2015    Procedure: TRANSESOPHAGEAL ECHOCARDIOGRAM (TEE);  Surgeon: Ivin Poot, MD;  Location: [place];  Service: Open Heart Surgery;  Laterality: N/A;     Allergies  Allergen Reactions  . Tape Rash  . Depakote Er [Divalproex Sodium Er] Other (See Comments)    Not effective      Family History  Problem  Relation Age of Onset  . Pancreatic cancer Mother   . Heart disease Mother   . Heart disease Father      Social History Mr. Henkels reports that he has been smoking Cigarettes and E-cigarettes.  He has a 40 pack-year smoking history. He does not have any smokeless tobacco history on file. Mr. Gilchrest reports that he does not drink alcohol.   Review of Systems CONSTITUTIONAL: No weight loss, fever, chills, weakness or fatigue.  HEENT: Eyes: No visual loss, blurred vision, double vision or yellow sclerae.No hearing loss, sneezing, congestion, runny nose or sore throat.  SKIN: No rash or itching.  CARDIOVASCULAR: per HPI RESPIRATORY: No shortness of breath, cough or sputum.  GASTROINTESTINAL: No anorexia, nausea, vomiting or diarrhea. No abdominal pain or blood.  GENITOURINARY: No burning on urination, no polyuria NEUROLOGICAL: No headache, dizziness, syncope, paralysis, ataxia, numbness or tingling in the extremities. No change in bowel or bladder control.  MUSCULOSKELETAL: No muscle, back pain, joint pain or stiffness.  LYMPHATICS: No enlarged nodes. No history of splenectomy.  PSYCHIATRIC: No history of depression or anxiety.  ENDOCRINOLOGIC: No reports of sweating, cold or heat intolerance. No polyuria or polydipsia.  Marland Kitchen   Physical Examination Filed Vitals:   02/28/15 1104  BP: 98/67  Pulse: 66   Filed Vitals:   02/28/15 1104  Height: 6\' 2"  (1.88 m)  Weight: 213 lb 1.9 oz (96.671 kg)    Gen: resting comfortably, no acute distress HEENT: no scleral icterus, pupils equal round and reactive, no palptable cervical adenopathy,  CV: RRR, no m/r/g, no JVD Resp: Clear to auscultation bilaterally GI: abdomen is soft, non-tender, non-distended, normal bowel sounds, no hepatosplenomegaly MSK: extremities are warm, no edema.  Skin: warm, no rash Neuro:  no focal deficits Psych: appropriate affect   Diagnostic Studies 01/2015 Cath  Mid LAD lesion, 100% stenosed.  1st  Diag-2 lesion, 100% stenosed.  1st Diag-1 lesion, 90% stenosed.  Ramus lesion, 95% stenosed.  Prox Cx lesion, 70% stenosed.  Prox Cx to Dist Cx lesion, 100% stenosed.  Prox RCA to Mid RCA lesion, 100% stenosed.  Dist RCA lesion, 100% stenosed.  Origin lesion, before 1st Mrg, 100% stenosed.  Prox Graft lesion, 100% stenosed.  Prox Graft to Mid Graft lesion, 95% stenosed.  Mid Graft to Dist Graft lesion, 95% stenosed.  Dist LAD lesion, 60% stenosed.  1. Severe 3 vessel occlusive CAD 2. Patent LIMA to the LAD 3. SVG to PDA is diffusely and severely diseased with extensive ectasia and thrombosis 4. Occluded SVG to OM 5. Occluded SVG to diagonal 6. Normal  LV EDP.   01/2015 Echo Study Conclusions  - Left ventricle: The cavity size was at the upper limits of normal. Wall thickness was normal. Systolic function was normal. The estimated ejection fraction was in the range of 55% to 60%. Regional wall motion abnormalities cannot be excluded. Left ventricular diastolic function parameters were normal. - Mitral valve: There was mild regurgitation.   Assessment and Plan  1. CAD - s/p redo CABG, recovering well - no recent cardiac symptoms - continue current meds  2. Postoperative afib - remains in NSR today, will stop amio  F/u 3 months      Arnoldo Lenis, M.D.

## 2015-03-05 ENCOUNTER — Other Ambulatory Visit: Payer: Self-pay | Admitting: Cardiothoracic Surgery

## 2015-03-05 DIAGNOSIS — Z951 Presence of aortocoronary bypass graft: Secondary | ICD-10-CM

## 2015-03-06 ENCOUNTER — Ambulatory Visit (INDEPENDENT_AMBULATORY_CARE_PROVIDER_SITE_OTHER): Payer: Self-pay | Admitting: Cardiothoracic Surgery

## 2015-03-06 ENCOUNTER — Encounter: Payer: Self-pay | Admitting: Cardiothoracic Surgery

## 2015-03-06 ENCOUNTER — Encounter: Payer: Self-pay | Admitting: *Deleted

## 2015-03-06 ENCOUNTER — Ambulatory Visit
Admission: RE | Admit: 2015-03-06 | Discharge: 2015-03-06 | Disposition: A | Discharge status: Home / Self Care | Payer: Medicare Other | Source: Ambulatory Visit | Attending: Cardiothoracic Surgery | Admitting: Cardiothoracic Surgery

## 2015-03-06 VITALS — BP 99/73 | HR 76 | Resp 20 | Ht 74.0 in | Wt 213.0 lb

## 2015-03-06 DIAGNOSIS — Z951 Presence of aortocoronary bypass graft: Secondary | ICD-10-CM

## 2015-03-06 DIAGNOSIS — I214 Non-ST elevation (NSTEMI) myocardial infarction: Secondary | ICD-10-CM

## 2015-03-06 NOTE — Progress Notes (Signed)
PCP is Joseph Art, MD Referring Provider is Jerline Pain, MD  Chief Complaint  Patient presents with  . Routine Post Op    f/u from surgery with CXR, s/p Redo coronary artery bypass grafting x2 02/04/15    [name] returns for routine followup one month at the redo CABG x2. He presented with unstable angina. 10 years ago he had CABG x4 at [facility] in [place]. He had a patent left IMA to the LAD but occluded vein grafts. The culprit vessel was felt to be the right coronary. He had redo vein grafts to the distal right coronary and to the diagonal branch the LAD. The circumflex branches were too small to graft. He had difficult targets and had a drop of allergic reaction at the end of the procedure possibly related to either protamine or his platelet transfusion. He septally did well and was discharged home on oral amiodarone for some transient atrial fibrillation.the patient was seen by his cardiologist last week and he amiodarone was discontinued since the patient is maintained sinus rhythm. The patient wishes to start outpatient cardiac rehabilitation at his local hospital which [facility].  The patient has a brain tumor and has had increased headaches since the surgery. He plans on having a brain MRI proximally 6 weeks after surgery. He should be safe doing so.   Past Medical History  Diagnosis Date  . Brain tumor     Clival Chordoma   . S/P CABG x 4     2006  . Myocardial infarction   . Diverticulitis large intestine   . Ventral hernia   . Chordoma of clivus   . Single kidney     sinus dis  . Sinus disease   . Myalgia and myositis   . Glucocorticoid deficiency   . H/O sinus surgery   . Coronary artery disease   . Dyspnea   . Fatigue   . Memory loss     Past Surgical History  Procedure Laterality Date  . Coronary artery bypass graft    . Cardiac catheterization    . Brain surgery    . Hernia repair    . Fracture surgery    . Cardiac  catheterization  01/29/2015    Procedure: Left Heart Cath and Cors/Grafts Angiography;  Surgeon: Peter M Martinique, MD;  Location: [place] [facility];  Service: Cardiovascular;;  . Colostomy    . Coronary artery bypass graft    . Coronary artery bypass graft N/A 02/04/2015    Procedure: REDO CORONARY ARTERY BYPASS GRAFTING x two, using left leg greater saphenous vein harvested endoscopically. ;  Surgeon: Ivin Poot, MD;  Location: [place];  Service: Open Heart Surgery;  Laterality: N/A;  . Tee without cardioversion N/A 02/04/2015    Procedure: TRANSESOPHAGEAL ECHOCARDIOGRAM (TEE);  Surgeon: Ivin Poot, MD;  Location: [place];  Service: Open Heart Surgery;  Laterality: N/A;    Family History  Problem Relation Age of Onset  . Pancreatic cancer Mother   . Heart disease Mother   . Heart disease Father     Social History History  Substance Use Topics  . Smoking status: Former Smoker -- 1.00 packs/day for 40 years    Types: Cigarettes, E-cigarettes    Quit date: 01/23/2015  . Smokeless tobacco: Not on file  . Alcohol Use: No    Current Outpatient Prescriptions  Medication Sig Dispense Refill  . aspirin EC 81 MG tablet Take 1 tablet (81 mg total) by mouth daily.  90 tablet 3  . atorvastatin (LIPITOR) 80 MG tablet Take 1 tablet (80 mg total) by mouth daily at 6 PM. 30 tablet 1  . carvedilol (COREG) 3.125 MG tablet Take 1 tablet (3.125 mg total) by mouth daily. 30 tablet 1  . clotrimazole-betamethasone (LOTRISONE) cream Apply 1 application topically as needed.     . docusate sodium (COLACE) 100 MG capsule Take 100 mg by mouth as needed for mild constipation.    . ferrous [id number] C-folic acid (TRINSICON / FOLTRIN) capsule Take 1 capsule by mouth 3 (three) times daily after meals. For one month then stop. 63 capsule 0  . HYDROcodone-acetaminophen (NORCO) 10-325 MG per tablet Take 1 tablet by mouth every 6 (six) hours as needed for severe pain. 30 tablet 0  .  Riboflavin-Magnesium-Feverfew (MIGRELIEF) 200-180-50 MG TABS Take 1 tablet by mouth 2 (two) times daily.    Marland Kitchen zolpidem (AMBIEN) 10 MG tablet Take 10 mg by mouth at bedtime.      No current facility-administered medications for this visit.    Allergies  Allergen Reactions  . Tape Rash  . Depakote Er [Divalproex Sodium Er] Other (See Comments)    Not effective    Review of Systems  Improved energy and appetite Walking without recurrent angina or dyspnea Musculoskeletal pain on right side of his chest but not taking narcotics  BP 99/73 mmHg  Pulse 76  Resp 20  Ht 6\' 2"  (1.88 m)  Wt 213 lb (96.616 kg)  BMI 27.34 kg/m2  SpO2 98% Physical Exam Alert and comfortable Sternal incision well-healed Lungs clear Legs without edema Heart rhythm regular, no murmur or gallop  Diagnostic Tests: Chest x-ray clear with out pleural effusion, sternal wires intact, cardiac silhouette stable  Impression: Doing well one month after redo CABG Continue current medications The patient may start driving and lifting up to 20 pounds. Will refer him to the cardiac rehabilitation program at [place] for review of progress in 6 weeks   Len Childs, MD [facility] [phone]

## 2015-03-06 NOTE — Progress Notes (Signed)
Patient ID: [name], male   DOB: 10/02/1955, 59 y.o.   MRN: [id number] A referral was faxed to [facility] to contact Mr. Loy to start cardiac rehab phase II per Dr. Lucianne Lei Tright's request.

## 2015-03-25 ENCOUNTER — Ambulatory Visit: Payer: Medicare Other | Admitting: Physician Assistant

## 2015-04-05 ENCOUNTER — Other Ambulatory Visit: Payer: Self-pay | Admitting: Physician Assistant

## 2015-04-11 ENCOUNTER — Telehealth: Payer: Self-pay | Admitting: Cardiology

## 2015-04-11 MED ORDER — CARVEDILOL 3.125 MG PO TABS: 3.1250 mg | ORAL_TABLET | Freq: Every day | ORAL | Status: DC

## 2015-04-11 MED ORDER — ATORVASTATIN CALCIUM 80 MG PO TABS: 80.0000 mg | ORAL_TABLET | Freq: Every day | ORAL | Status: DC

## 2015-04-11 NOTE — Telephone Encounter (Signed)
carvedilol (COREG) 3.125 MG tablet atorvastatin (LIPITOR) 80 MG tablet  Please call patient know if he needs to continue medication.  If so he will need refills sent into [facility] in [place]

## 2015-04-11 NOTE — Telephone Encounter (Signed)
Went over recent med changes per LOV with JB, refilled medication as requested. Pt voiced understanding

## 2015-04-17 ENCOUNTER — Ambulatory Visit: Payer: Medicare Other | Admitting: Cardiothoracic Surgery

## 2015-04-24 ENCOUNTER — Encounter: Payer: Self-pay | Admitting: Cardiothoracic Surgery

## 2015-04-24 ENCOUNTER — Ambulatory Visit (INDEPENDENT_AMBULATORY_CARE_PROVIDER_SITE_OTHER): Payer: Self-pay | Admitting: Cardiothoracic Surgery

## 2015-04-24 VITALS — BP 96/73 | HR 79 | Resp 16 | Ht 74.0 in | Wt 208.0 lb

## 2015-04-24 DIAGNOSIS — I214 Non-ST elevation (NSTEMI) myocardial infarction: Secondary | ICD-10-CM

## 2015-04-24 DIAGNOSIS — Z951 Presence of aortocoronary bypass graft: Secondary | ICD-10-CM

## 2015-04-24 NOTE — Progress Notes (Signed)
PCP is Joseph Art, MD Referring Provider is Jerline Pain, MD  Chief Complaint  Patient presents with  . Follow-up    6 week f/u s/p CABG    YQM:VHQIONG followup after redo CABG. The patient is progressing slowly. Complains of weakness and tiredness. He is attending outpatient cardiac rehabilitation at [facility]. He denies symptoms of angina, orthopnea, PND,. He denies shortness of breath during his rehabilitation activities.surgical incision healing well.postop chest x-ray has been clear. He is taking Coreg 3.125 mg daily.  He has history of unresectable brain cancer and significant headaches and short-term memory loss. Past Medical History  Diagnosis Date  . Brain tumor     Clival Chordoma   . S/P CABG x 4     2006  . Myocardial infarction   . Diverticulitis large intestine   . Ventral hernia   . Chordoma of clivus   . Single kidney     sinus dis  . Sinus disease   . Myalgia and myositis   . Glucocorticoid deficiency   . H/O sinus surgery   . Coronary artery disease   . Dyspnea   . Fatigue   . Memory loss     Past Surgical History  Procedure Laterality Date  . Coronary artery bypass graft    . Cardiac catheterization    . Brain surgery    . Hernia repair    . Fracture surgery    . Cardiac catheterization  01/29/2015    Procedure: Left Heart Cath and Cors/Grafts Angiography;  Surgeon: Peter M Martinique, MD;  Location: [place] [facility];  Service: Cardiovascular;;  . Colostomy    . Coronary artery bypass graft    . Coronary artery bypass graft N/A 02/04/2015    Procedure: REDO CORONARY ARTERY BYPASS GRAFTING x two, using left leg greater saphenous vein harvested endoscopically. ;  Surgeon: Ivin Poot, MD;  Location: [place];  Service: Open Heart Surgery;  Laterality: N/A;  . Tee without cardioversion N/A 02/04/2015    Procedure: TRANSESOPHAGEAL ECHOCARDIOGRAM (TEE);  Surgeon: Ivin Poot, MD;  Location: [place];  Service: Open Heart Surgery;   Laterality: N/A;    Family History  Problem Relation Age of Onset  . Pancreatic cancer Mother   . Heart disease Mother   . Heart disease Father     Social History Social History  Substance Use Topics  . Smoking status: Former Smoker -- 1.00 packs/day for 40 years    Types: Cigarettes, E-cigarettes    Quit date: 01/23/2015  . Smokeless tobacco: None  . Alcohol Use: No    Current Outpatient Prescriptions  Medication Sig Dispense Refill  . aspirin EC 81 MG tablet Take 1 tablet (81 mg total) by mouth daily. 90 tablet 3  . atorvastatin (LIPITOR) 80 MG tablet Take 1 tablet (80 mg total) by mouth daily at 6 PM. 90 tablet 3  . carvedilol (COREG) 3.125 MG tablet Take 1 tablet (3.125 mg total) by mouth daily. 90 tablet 3  . clotrimazole-betamethasone (LOTRISONE) cream Apply 1 application topically as needed.     . docusate sodium (COLACE) 100 MG capsule Take 100 mg by mouth as needed for mild constipation.    . ferrous [id number] C-folic acid (TRINSICON / FOLTRIN) capsule Take 1 capsule by mouth 3 (three) times daily after meals. For one month then stop. 63 capsule 0  . HYDROcodone-acetaminophen (NORCO) 10-325 MG per tablet Take 1 tablet by mouth every 6 (six) hours as needed for severe pain. [place]  tablet 0  . Riboflavin-Magnesium-Feverfew (MIGRELIEF) 200-180-50 MG TABS Take 1 tablet by mouth 2 (two) times daily.    Marland Kitchen zolpidem (AMBIEN) 10 MG tablet Take 10 mg by mouth at bedtime.      No current facility-administered medications for this visit.    Allergies  Allergen Reactions  . Tape Rash  . Depakote Er [Divalproex Sodium Er] Other (See Comments)    Not effective    Review of Systems   Patient gets short-winded going up and down the flight of stairs. No angina. Good appetite. Sleeping well. Try to stop smoking cigarettes.  BP 96/73 mmHg  Pulse 79  Resp 16  Ht 6\' 2"  (1.88 m)  Wt 208 lb (94.348 kg)  BMI 26.69 kg/m2  SpO2 98% Physical Exam Alert and comfortable,  pleasant Conjunctiva pink Lungs clear Heart rate regular without murmur Sternal and leg incision well-healed No pedal edema  Diagnostic Tests: No tests today We'll check CBC and echocardiogram because of his complaints of decreased exercise tolerance and easy fatigability  Impression: Clinically recovering from redo CABG less than 3 months ago.  Plan:return for review results of echocardiogram and CBC and to discuss his complaint of severe fatigue.   Len Childs, MD [facility] [phone]

## 2015-04-25 ENCOUNTER — Other Ambulatory Visit: Payer: Self-pay | Admitting: *Deleted

## 2015-04-25 DIAGNOSIS — D500 Iron deficiency anemia secondary to blood loss (chronic): Secondary | ICD-10-CM

## 2015-04-25 DIAGNOSIS — Z951 Presence of aortocoronary bypass graft: Secondary | ICD-10-CM

## 2015-04-25 DIAGNOSIS — I2510 Atherosclerotic heart disease of native coronary artery without angina pectoris: Secondary | ICD-10-CM

## 2015-05-01 ENCOUNTER — Other Ambulatory Visit (HOSPITAL_COMMUNITY)
Admission: RE | Admit: 2015-05-01 | Discharge: 2015-05-01 | Disposition: A | Discharge status: Home / Self Care | Payer: Medicare Other | Source: Ambulatory Visit | Attending: Cardiothoracic Surgery | Admitting: Cardiothoracic Surgery

## 2015-05-01 ENCOUNTER — Ambulatory Visit (HOSPITAL_COMMUNITY)
Admission: RE | Admit: 2015-05-01 | Discharge: 2015-05-01 | Disposition: A | Discharge status: Home / Self Care | Payer: Medicare Other | Source: Ambulatory Visit | Attending: Cardiothoracic Surgery | Admitting: Cardiothoracic Surgery

## 2015-05-01 DIAGNOSIS — D500 Iron deficiency anemia secondary to blood loss (chronic): Secondary | ICD-10-CM | POA: Insufficient documentation

## 2015-05-01 DIAGNOSIS — Z951 Presence of aortocoronary bypass graft: Secondary | ICD-10-CM | POA: Diagnosis not present

## 2015-05-01 DIAGNOSIS — I2510 Atherosclerotic heart disease of native coronary artery without angina pectoris: Secondary | ICD-10-CM | POA: Insufficient documentation

## 2015-05-01 DIAGNOSIS — R079 Chest pain, unspecified: Secondary | ICD-10-CM | POA: Insufficient documentation

## 2015-05-01 LAB — CBC
HCT: 38.3 % — ABNORMAL LOW (ref 39.0–52.0)
Hemoglobin: 13 g/dL (ref 13.0–17.0)
MCH: 30.4 pg (ref 26.0–34.0)
MCHC: 33.9 g/dL (ref 30.0–36.0)
MCV: 89.7 fL (ref 78.0–100.0)
Platelets: 158 10*3/uL (ref 150–400)
RBC: 4.27 MIL/uL (ref 4.22–5.81)
RDW: 13.4 % (ref 11.5–15.5)
WBC: 6.4 10*3/uL (ref 4.0–10.5)

## 2015-05-29 ENCOUNTER — Encounter: Payer: Self-pay | Admitting: Cardiothoracic Surgery

## 2015-05-29 ENCOUNTER — Ambulatory Visit (INDEPENDENT_AMBULATORY_CARE_PROVIDER_SITE_OTHER): Payer: Medicare Other | Admitting: Cardiothoracic Surgery

## 2015-05-29 VITALS — BP 109/79 | HR 76 | Resp 20 | Ht 74.0 in | Wt 208.0 lb

## 2015-05-29 DIAGNOSIS — Z951 Presence of aortocoronary bypass graft: Secondary | ICD-10-CM | POA: Diagnosis not present

## 2015-05-29 DIAGNOSIS — I214 Non-ST elevation (NSTEMI) myocardial infarction: Secondary | ICD-10-CM | POA: Diagnosis not present

## 2015-05-29 NOTE — Progress Notes (Signed)
PCP is Joseph Art, MD Referring Provider is Jerline Pain, MD  Chief Complaint  Patient presents with  . Routine Post Op    2 month f/u, review ECHO and LAB from 05/01/15    HPI: Final office visit after undergoing redo CABG 3 months ago. On his last visit the patient complained of fatigue and decreased exercise tolerance. He presents today with a interval echocardiogram showing baseline EF unchanged at 50% and a CBC demonstrating hematocrit 38%. The patient voluntarily stopped taking his medications including his beta blocker and his statin to see if that would improve his symptoms and he states he feels better of those drugs. He also stop his aspirin which I strongly recommended that he continue. He denies any angina. He states his weight is stable. Denies any edema or symptoms of CHF.  Past Medical History  Diagnosis Date  . Brain tumor ([place])     Clival Chordoma   . S/P CABG x 4     2006  . Myocardial infarction ([place])   . Diverticulitis large intestine   . Ventral hernia   . Chordoma of clivus ([place])   . Single kidney     sinus dis  . Sinus disease   . Myalgia and myositis   . Glucocorticoid deficiency ([place])   . H/O sinus surgery   . Coronary artery disease   . Dyspnea   . Fatigue   . Memory loss     Past Surgical History  Procedure Laterality Date  . Coronary artery bypass graft    . Cardiac catheterization    . Brain surgery    . Hernia repair    . Fracture surgery    . Cardiac catheterization  01/29/2015    Procedure: Left Heart Cath and Cors/Grafts Angiography;  Surgeon: Peter M Martinique, MD;  Location: [place] [facility];  Service: Cardiovascular;;  . Colostomy    . Coronary artery bypass graft    . Coronary artery bypass graft N/A 02/04/2015    Procedure: REDO CORONARY ARTERY BYPASS GRAFTING x two, using left leg greater saphenous vein harvested endoscopically. ;  Surgeon: Ivin Poot, MD;  Location: [place];  Service: Open Heart Surgery;  Laterality: N/A;  . Tee  without cardioversion N/A 02/04/2015    Procedure: TRANSESOPHAGEAL ECHOCARDIOGRAM (TEE);  Surgeon: Ivin Poot, MD;  Location: [place];  Service: Open Heart Surgery;  Laterality: N/A;    Family History  Problem Relation Age of Onset  . Pancreatic cancer Mother   . Heart disease Mother   . Heart disease Father     Social History Social History  Substance Use Topics  . Smoking status: Former Smoker -- 1.00 packs/day for 40 years    Types: Cigarettes, E-cigarettes    Quit date: 01/23/2015  . Smokeless tobacco: None  . Alcohol Use: No    Current Outpatient Prescriptions  Medication Sig Dispense Refill  . HYDROcodone-acetaminophen (NORCO) 10-325 MG per tablet Take 1 tablet by mouth every 6 (six) hours as needed for severe pain. 30 tablet 0  . zolpidem (AMBIEN) 10 MG tablet Take 10 mg by mouth at bedtime.      No current facility-administered medications for this visit.    Allergies  Allergen Reactions  . Tape Rash  . Depakote Er [Divalproex Sodium Er] Other (See Comments)    Not effective    Review of Systems   incisions remain well-healed  BP 109/79 mmHg  Pulse 76  Resp 20  Ht 6\' 2"  (  1.88 m)  Wt 208 lb (94.348 kg)  BMI 26.69 kg/m2  SpO2 95% Physical Exam Alert and comfortable Lungs clear Heart rhythm regular without murmur Sternal incision and leg incision is healed  No peripheral edema Neuro intact  Diagnostic Tests: Echocardiogram results and CBC results personally counseled with patient and wife  Impression: Good recovery after redo CABG Patient was recommended to be compliant with his cardiologist recommendations for medical therapy of his heart disease Plan: Return as needed for any surgical issues. No restrictions on his activity at this point following redo CABG  Len Childs, MD [facility] [phone]

## 2015-06-06 ENCOUNTER — Telehealth: Payer: Self-pay | Admitting: Cardiology

## 2015-06-06 NOTE — Telephone Encounter (Signed)
We do need to follow up. If he is doing well ok to push back 2 months.   Zandra Abts MD   Pt says he is doing well, scheduled pt for 08/31/15. Pt says he will call if anything changes

## 2015-06-06 NOTE — Telephone Encounter (Signed)
I contacted patient to schedule Oct appointment with Dr Harl Bowie.  Patient is questioning if appointment is needed so soon after seeing Dr Prescott Gum.   Can you advise.

## 2015-08-27 ENCOUNTER — Ambulatory Visit: Payer: Medicare Other | Admitting: Cardiology

## 2015-09-02 ENCOUNTER — Encounter: Payer: Medicare Other | Admitting: Cardiology

## 2015-09-02 NOTE — Progress Notes (Signed)
Patient ID: [name], male   DOB: 1955-12-27, 60 y.o.   MRN: [id number]   Encounter opened in error

## 2015-09-27 ENCOUNTER — Encounter: Payer: Self-pay | Admitting: *Deleted

## 2015-09-27 ENCOUNTER — Encounter: Payer: Self-pay | Admitting: Cardiology

## 2015-09-27 ENCOUNTER — Ambulatory Visit (INDEPENDENT_AMBULATORY_CARE_PROVIDER_SITE_OTHER): Payer: Medicare PPO | Admitting: Cardiology

## 2015-09-27 VITALS — BP 115/79 | HR 69 | Ht 74.0 in | Wt 237.8 lb

## 2015-09-27 DIAGNOSIS — I2510 Atherosclerotic heart disease of native coronary artery without angina pectoris: Secondary | ICD-10-CM | POA: Diagnosis not present

## 2015-09-27 NOTE — Patient Instructions (Signed)

## 2015-09-27 NOTE — Progress Notes (Signed)
Patient ID: [name], male   DOB: 05-25-1956, 60 y.o.   MRN: [id number]     Clinical Summary Mr. [name] is a 60 y.o.male seen today for follow up of the following medical problems.   1. CAD - history of CABG 10 years ago in [place]. - admit 01/2015 at [facility] with chest pain for NSTEMI. Repeat CABG with SVG-OM and SVG-PDA -echo 01/2015 LVEF 55-60%  - denies any chest pain. No SOB or DOE.  - compliant with meds. Taking aspirin. Not interested in taking other meds.    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 Has had 2 prior brain operations.  - reports fairly poor prognosis, this is one of the reasons he has limited interest in taking multiple cardiac meds.   3. COPD - former smoker on and off x 40 years - he has not been interested in inhalers.   4. Night time hypoxia - has stopped using nighttime O2.  Past Medical History  Diagnosis Date  . Brain tumor ([place])     Clival Chordoma   . S/P CABG x 4     2006  . Myocardial infarction ([place])   . Diverticulitis large intestine   . Ventral hernia   . 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 Single kidney     sinus dis  . Sinus disease   . Myalgia and myositis   . 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 H/O sinus surgery   . Coronary artery disease   . Dyspnea   . Fatigue   . Memory loss      Allergies  Allergen Reactions  . Tape Rash  . Depakote Er [Divalproex Sodium Er] Other (See Comments)    Not effective     Current Outpatient Prescriptions  Medication Sig Dispense Refill  . HYDROcodone-acetaminophen (NORCO) 10-325 MG per tablet Take 1 tablet by mouth every 6 (six) hours as needed for severe pain. 30 tablet 0  . zolpidem (AMBIEN) 10 MG tablet Take 10 mg by mouth at bedtime.      No current facility-administered medications for this visit.     Past Surgical History  Procedure Laterality Date  . Coronary artery bypass graft    . Cardiac catheterization    . Brain surgery    . Hernia repair    .  Fracture surgery    . Cardiac catheterization  01/29/2015    Procedure: Left Heart Cath and Cors/Grafts Angiography;  Surgeon: Peter M Martinique, MD;  Location: [place] [facility];  Service: Cardiovascular;;  . Colostomy    . Coronary artery bypass graft    . Coronary artery bypass graft N/A 02/04/2015    Procedure: REDO CORONARY ARTERY BYPASS GRAFTING x two, using left leg greater saphenous vein harvested endoscopically. ;  Surgeon: Ivin Poot, MD;  Location: [place];  Service: Open Heart Surgery;  Laterality: N/A;  . Tee without cardioversion N/A 02/04/2015    Procedure: TRANSESOPHAGEAL ECHOCARDIOGRAM (TEE);  Surgeon: Ivin Poot, MD;  Location: [place];  Service: Open Heart Surgery;  Laterality: N/A;     Allergies  Allergen Reactions  . Tape Rash  . Depakote Er [Divalproex Sodium Er] Other (See Comments)    Not effective      Family History  Problem Relation Age of Onset  . Pancreatic cancer Mother   . Heart disease Mother   . Heart disease Father      Social History Mr. [name] reports that he quit smoking about  8 months ago. His smoking use included Cigarettes and E-cigarettes. He has a 40 pack-year smoking history. He does not have any smokeless tobacco history on file. Mr. [name] reports that he does not drink alcohol.   Review of Systems CONSTITUTIONAL: No weight loss, fever, chills, weakness or fatigue.  HEENT: Eyes: No visual loss, blurred vision, double vision or yellow sclerae.No hearing loss, sneezing, congestion, runny nose or sore throat.  SKIN: No rash or itching.  CARDIOVASCULAR: per HPI RESPIRATORY: + SOB, wheezing at times.  GASTROINTESTINAL: No anorexia, nausea, vomiting or diarrhea. No abdominal pain or blood.  GENITOURINARY: No burning on urination, no polyuria NEUROLOGICAL: No headache, dizziness, syncope, paralysis, ataxia, numbness or tingling in the extremities. No change in bowel or bladder control.  MUSCULOSKELETAL: No muscle, back pain, joint  pain or stiffness.  LYMPHATICS: No enlarged nodes. No history of splenectomy.  PSYCHIATRIC: No history of depression or anxiety.  ENDOCRINOLOGIC: No reports of sweating, cold or heat intolerance. No polyuria or polydipsia.  Marland Kitchen   Physical Examination Filed Vitals:   09/27/15 1333  BP: 115/79  Pulse: 69   Filed Vitals:   09/27/15 1333  Height: 6\' 2"  (1.88 m)  Weight: 237 lb 12.8 oz (107.865 kg)    Gen: resting comfortably, no acute distress HEENT: no scleral icterus, pupils equal round and reactive, no palptable cervical adenopathy,  CV: RRR, no m/r/g, no jvd Resp: Clear to auscultation bilaterally GI: abdomen is soft, non-tender, non-distended, normal bowel sounds, no hepatosplenomegaly MSK: extremities are warm, no edema.  Skin: warm, no rash Neuro:  no focal deficits Psych: appropriate affect   Diagnostic Studies 01/2015 Cath  Mid LAD lesion, 100% stenosed.  1st Diag-2 lesion, 100% stenosed.  1st Diag-1 lesion, 90% stenosed.  Ramus lesion, 95% stenosed.  Prox Cx lesion, 70% stenosed.  Prox Cx to Dist Cx lesion, 100% stenosed.  Prox RCA to Mid RCA lesion, 100% stenosed.  Dist RCA lesion, 100% stenosed.  Origin lesion, before 1st Mrg, 100% stenosed.  Prox Graft lesion, 100% stenosed.  Prox Graft to Mid Graft lesion, 95% stenosed.  Mid Graft to Dist Graft lesion, 95% stenosed.  Dist LAD lesion, 60% stenosed.  1. Severe 3 vessel occlusive CAD 2. Patent LIMA to the LAD 3. SVG to PDA is diffusely and severely diseased with extensive ectasia and thrombosis 4. Occluded SVG to OM 5. Occluded SVG to diagonal 6. Normal LV EDP.   01/2015 Echo Study Conclusions  - Left ventricle: The cavity size was at the upper limits of normal. Wall thickness was normal. Systolic function was normal. The estimated ejection fraction was in the range of 55% to 60%. Regional wall motion abnormalities cannot be excluded. Left ventricular diastolic function parameters  were normal. - Mitral valve: There was mild regurgitation.    Assessment and Plan   1. CAD - s/p redo CABG, he has done well since surgery - no recent cardiac symptoms - he does not wish to take additional meds other than aspirin. He reports he carries an overall prognosis from a brain tumor he has, and because of this is not interested in taking several meds daily.        Arnoldo Lenis, M.D.

## 2015-11-26 ENCOUNTER — Encounter: Payer: Self-pay | Admitting: Cardiology

## 2016-10-20 ENCOUNTER — Encounter: Payer: Self-pay | Admitting: *Deleted

## 2016-10-21 ENCOUNTER — Ambulatory Visit (INDEPENDENT_AMBULATORY_CARE_PROVIDER_SITE_OTHER): Payer: Medicare PPO | Admitting: Cardiology

## 2016-10-21 ENCOUNTER — Encounter: Payer: Self-pay | Admitting: Cardiology

## 2016-10-21 VITALS — BP 96/69 | HR 74 | Ht 74.0 in | Wt 244.0 lb

## 2016-10-21 DIAGNOSIS — J449 Chronic obstructive pulmonary disease, unspecified: Secondary | ICD-10-CM | POA: Diagnosis not present

## 2016-10-21 DIAGNOSIS — I2510 Atherosclerotic heart disease of native coronary artery without angina pectoris: Secondary | ICD-10-CM | POA: Diagnosis not present

## 2016-10-21 DIAGNOSIS — R0609 Other forms of dyspnea: Secondary | ICD-10-CM | POA: Diagnosis not present

## 2016-10-21 MED ORDER — TIOTROPIUM BROMIDE MONOHYDRATE 18 MCG IN CAPS: 18.0000 ug | ORAL_CAPSULE | Freq: Every day | RESPIRATORY_TRACT | 6 refills | Status: AC

## 2016-10-21 NOTE — Progress Notes (Signed)
Clinical Summary [name] is a 61 y.o.male seen today for follow up of the following medical problems.   1. CAD - history of CABG 10 years ago in [place]. - admit 01/2015 at [facility] with chest pain for NSTEMI. Repeat CABG with SVG-OM and SVG-PDA -echo 01/2015 LVEF 55-60%   - no recent chest pain. Has had some recent DOE about a year, progressing. No recent edema. No orthopnea. - no cough, no wheezing. Has some low O2 at night.   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 Has had 2 prior brain operations.  - reports fairly poor prognosis, this is one of the reasons he has limited interest in taking multiple cardiac meds.   3. COPD - moderate by PFTs in 01/2015 - former smoker on and off x 40 years - he has not been interested in inhalers.   - recent DOE as described above   4. Night time hypoxia - has stopped using nighttime O2.    Past Medical History:  Diagnosis Date  . Brain tumor ([place])    Clival Chordoma   . 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 Coronary artery disease   . Diverticulitis large intestine   . Dyspnea   . Fatigue   . 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 H/O sinus surgery   . Memory loss   . Myalgia and myositis   . Myocardial infarction   . S/P CABG x 4    2006  . Single kidney    sinus dis  . Sinus disease   . Ventral hernia      Allergies  Allergen Reactions  . Tape Rash  . Depakote Er [Divalproex Sodium Er] Other (See Comments)    Not effective     Current Outpatient Prescriptions  Medication Sig Dispense Refill  . aspirin 81 MG tablet Take 81 mg by mouth daily.    Marland Kitchen HYDROcodone-acetaminophen (NORCO) 10-325 MG per tablet Take 1 tablet by mouth every 6 (six) hours as needed for severe pain. 30 tablet 0  . zolpidem (AMBIEN) 10 MG tablet Take 10 mg by mouth at bedtime.      No current facility-administered medications for this visit.      Past Surgical History:  Procedure Laterality Date  . BRAIN SURGERY    .  CARDIAC CATHETERIZATION    . CARDIAC CATHETERIZATION  01/29/2015   Procedure: Left Heart Cath and Cors/Grafts Angiography;  Surgeon: Peter M Martinique, MD;  Location: [place] [facility];  Service: Cardiovascular;;  . COLOSTOMY    . CORONARY ARTERY BYPASS GRAFT    . CORONARY ARTERY BYPASS GRAFT    . CORONARY ARTERY BYPASS GRAFT N/A 02/04/2015   Procedure: REDO CORONARY ARTERY BYPASS GRAFTING x two, using left leg greater saphenous vein harvested endoscopically. ;  Surgeon: Ivin Poot, MD;  Location: [place];  Service: Open Heart Surgery;  Laterality: N/A;  . FRACTURE SURGERY    . HERNIA REPAIR    . TEE WITHOUT CARDIOVERSION N/A 02/04/2015   Procedure: TRANSESOPHAGEAL ECHOCARDIOGRAM (TEE);  Surgeon: Ivin Poot, MD;  Location: [place];  Service: Open Heart Surgery;  Laterality: N/A;     Allergies  Allergen Reactions  . Tape Rash  . Depakote Er [Divalproex Sodium Er] Other (See Comments)    Not effective      Family History  Problem Relation Age of Onset  . Pancreatic cancer Mother   . Heart disease Mother   . Heart disease  Father      Social History Mr. Tellechea reports that he quit smoking about 17 months ago. His smoking use included Cigarettes and E-cigarettes. He has a 40.00 pack-year smoking history. He does not have any smokeless tobacco history on file. Mr. Manas reports that he does not drink alcohol.   Review of Systems CONSTITUTIONAL: No weight loss, fever, chills, weakness or fatigue.  HEENT: Eyes: No visual loss, blurred vision, double vision or yellow sclerae.No hearing loss, sneezing, congestion, runny nose or sore throat.  SKIN: No rash or itching.  CARDIOVASCULAR: per HPI RESPIRATORY: per HPI GASTROINTESTINAL: No anorexia, nausea, vomiting or diarrhea. No abdominal pain or blood.  GENITOURINARY: No burning on urination, no polyuria NEUROLOGICAL: No headache, dizziness, syncope, paralysis, ataxia, numbness or tingling in the extremities. No change in bowel  or bladder control.  MUSCULOSKELETAL: No muscle, back pain, joint pain or stiffness.  LYMPHATICS: No enlarged nodes. No history of splenectomy.  PSYCHIATRIC: No history of depression or anxiety.  ENDOCRINOLOGIC: No reports of sweating, cold or heat intolerance. No polyuria or polydipsia.  Marland Kitchen   Physical Examination Vitals:   10/21/16 1132  BP: 96/69  Pulse: 74   Vitals:   10/21/16 1132  Weight: 244 lb (110.7 kg)  Height: 6\' 2"  (1.88 m)    Gen: resting comfortably, no acute distress HEENT: no scleral icterus, pupils equal round and reactive, no palptable cervical adenopathy,  CV: RRR, no m/r/g, no jvd Resp: Clear to auscultation bilaterally GI: abdomen is soft, non-tender, non-distended, normal bowel sounds, no hepatosplenomegaly MSK: extremities are warm, no edema.  Skin: warm, no rash Neuro:  no focal deficits Psych: appropriate affect   Diagnostic Studies 01/2015 Cath  Mid LAD lesion, 100% stenosed.  1st Diag-2 lesion, 100% stenosed.  1st Diag-1 lesion, 90% stenosed.  Ramus lesion, 95% stenosed.  Prox Cx lesion, 70% stenosed.  Prox Cx to Dist Cx lesion, 100% stenosed.  Prox RCA to Mid RCA lesion, 100% stenosed.  Dist RCA lesion, 100% stenosed.  Origin lesion, before 1st Mrg, 100% stenosed.  Prox Graft lesion, 100% stenosed.  Prox Graft to Mid Graft lesion, 95% stenosed.  Mid Graft to Dist Graft lesion, 95% stenosed.  Dist LAD lesion, 60% stenosed.  1. Severe 3 vessel occlusive CAD 2. Patent LIMA to the LAD 3. SVG to PDA is diffusely and severely diseased with extensive ectasia and thrombosis 4. Occluded SVG to OM 5. Occluded SVG to diagonal 6. Normal LV EDP.   01/2015 Echo Study Conclusions  - Left ventricle: The cavity size was at the upper limits of normal. Wall thickness was normal. Systolic function was normal. The estimated ejection fraction was in the range of 55% to 60%. Regional wall motion abnormalities cannot be excluded.  Left ventricular diastolic function parameters were normal. - Mitral valve: There was mild regurgitation.      Assessment and Plan  1. CAD - s/p redo CABG - no recent chest pain, has had some recent DOE. We will obtain echo. Would not pursue ischemic testing, as he would not be a cath candidate given his brain tumor. If cardiac dysfunction, managed medcially.  - he does not wish to take additional meds other than aspirin. He reports he carries an overall prognosis from a brain tumor he has, and because of this is not interested in taking several meds daily. - EKG in clinic shows SR, no acute ischemic changes  2. DOE - obtain echo as described above - PFTs have shown moderate COPD in the past.  He is not on therapy. Start spiriva 49mcg daily       F/u pending test results   Arnoldo Lenis, M.D.

## 2016-10-21 NOTE — Patient Instructions (Signed)
Your physician recommends that you schedule a follow-up appointment in: TO BE DETERMINED AFTER TESTING  Your physician has recommended you make the following change in your medication:   START [employer] 18MCG DAILY   Your physician has requested that you have an echocardiogram. Echocardiography is a painless test that uses sound waves to create images of your heart. It provides your doctor with information about the size and shape of your heart and how well your heart's chambers and valves are working. This procedure takes approximately one hour. There are no restrictions for this procedure.   Thank you for choosing [facility]!!

## 2016-10-30 ENCOUNTER — Other Ambulatory Visit: Payer: Self-pay | Admitting: *Deleted

## 2016-10-30 DIAGNOSIS — R0609 Other forms of dyspnea: Principal | ICD-10-CM

## 2016-11-10 ENCOUNTER — Telehealth: Payer: Self-pay | Admitting: Cardiology

## 2016-11-10 NOTE — Telephone Encounter (Signed)
Patient was wanting to have echo at [facility] on 10/21/16 and left a message with department to retunr call.   10/29/16 and 11/04/16 called [phone] and [phone] to leave message with Violet. Need echo scheduled as soon as we can get it.

## 2016-11-10 NOTE — Telephone Encounter (Signed)
Office notes with Echo order faxed to [facility] [place]  Authorization # [id number]   Exp  4/22/218

## 2016-11-11 NOTE — Telephone Encounter (Signed)
Received notice from [facility] that echo has been scheduled for 11/13/16 at 811 am. per cert obtained. Patient notified.

## 2016-11-12 ENCOUNTER — Other Ambulatory Visit: Payer: Medicare PPO

## 2016-12-03 ENCOUNTER — Encounter: Payer: Self-pay | Admitting: *Deleted

## 2016-12-10 IMAGING — CR DG CHEST 1V PORT
1 series · 1 of 1 positions shown · non-contrast
Comparison: 02/04/2015.

CLINICAL DATA: CABG.

EXAM:
PORTABLE CHEST - 1 VIEW

[AP]
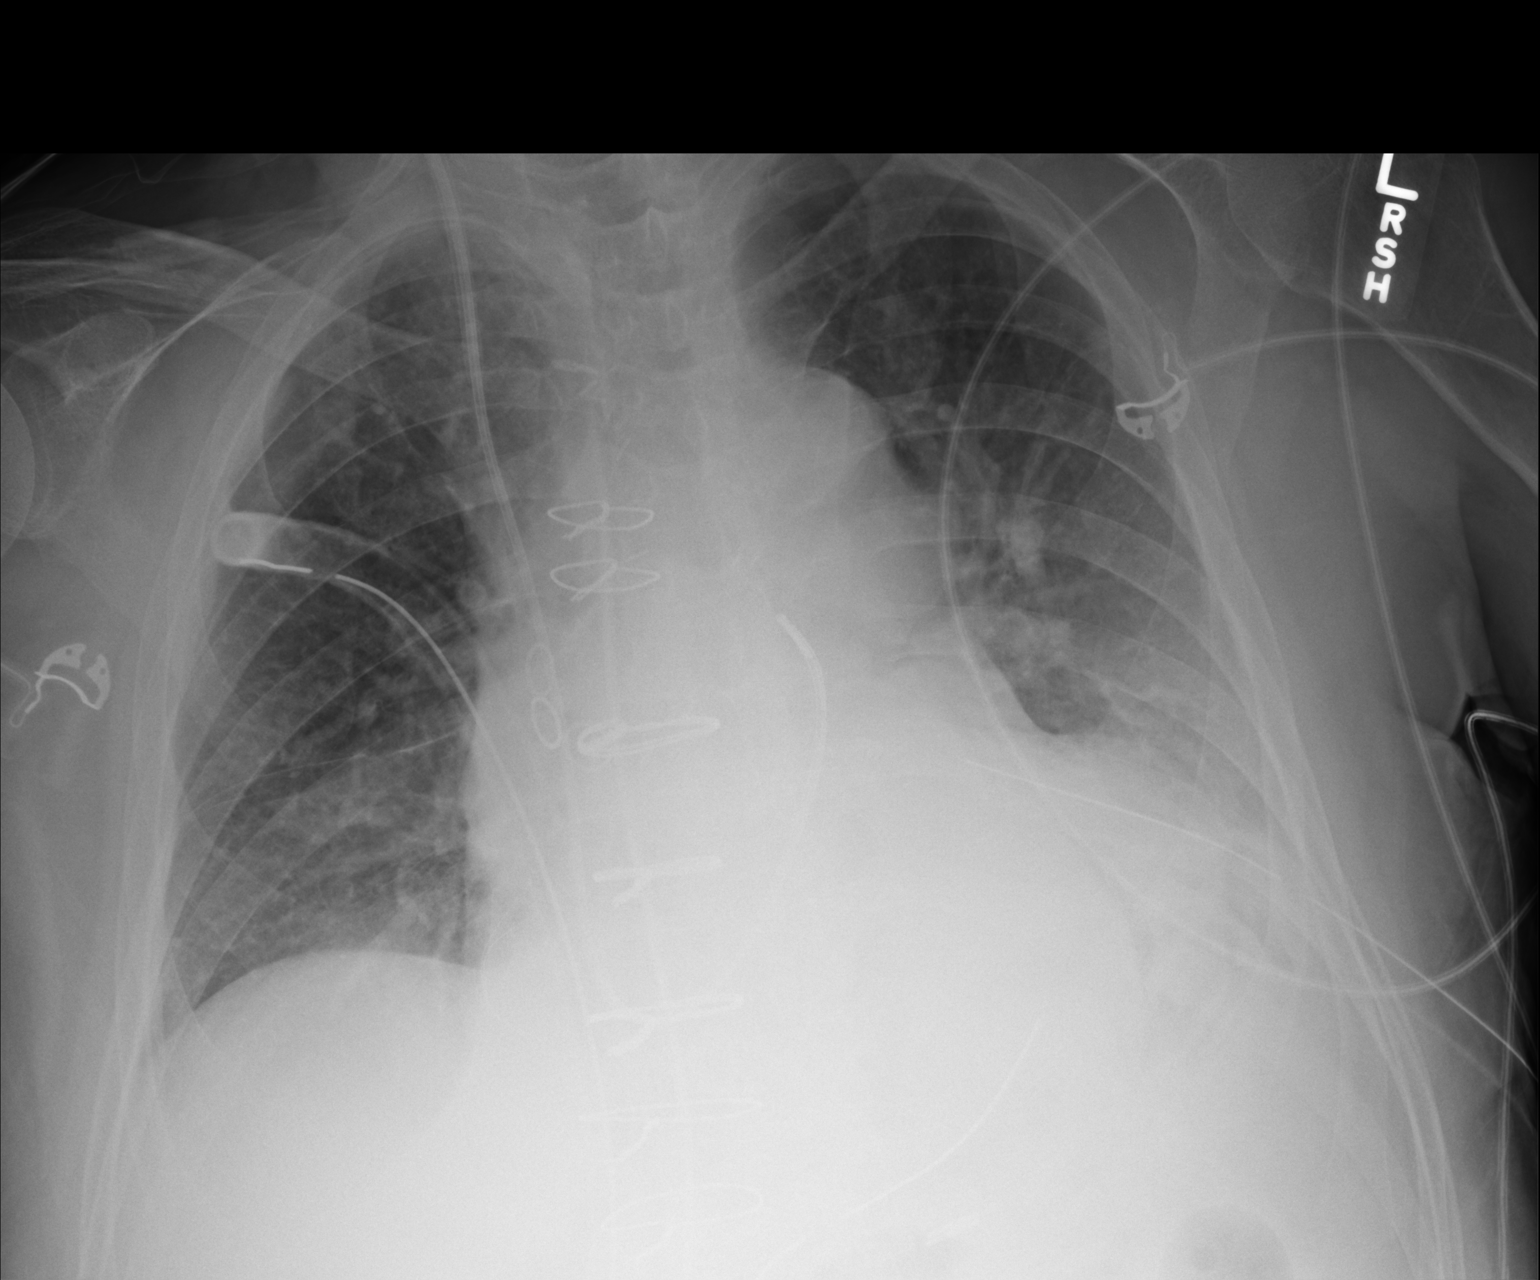

[1 of 1 positions shown; findings below may reference images not displayed]

FINDINGS: Interim extubation. Swan-Ganz catheter, mediastinal drainage
catheter, bilateral chest tubes in stable position. Prior CABG.
Stable cardiomegaly. Low lung volumes with bibasilar atelectasis
and/or infiltrates. Mild interstitial prominence. A mild component
congestive heart failure cannot be completely excluded. The
previously identified tiny left pneumothorax not definitely
identified on this exam.
IMPRESSION: 1. Interim extubation. Remaining lines and tubes in stable position.
2. Prior CABG. Persistent cardiomegaly. Mild bilateral interstitial
prominence noted. Mild component congestive heart failure.
3. Low lung volumes with bibasilar atelectasis and/or infiltrates.
4. Previously identified tiny left apical pneumothorax not
definitely identified on this exam .

## 2016-12-11 IMAGING — CR DG CHEST 1V PORT
1 series · 1 of 1 positions shown · non-contrast
Comparison: 02/05/2015 and earlier.

CLINICAL DATA: 58-year-old male status post CABG. Initial
encounter.

EXAM:
PORTABLE CHEST - 1 VIEW

[AP]
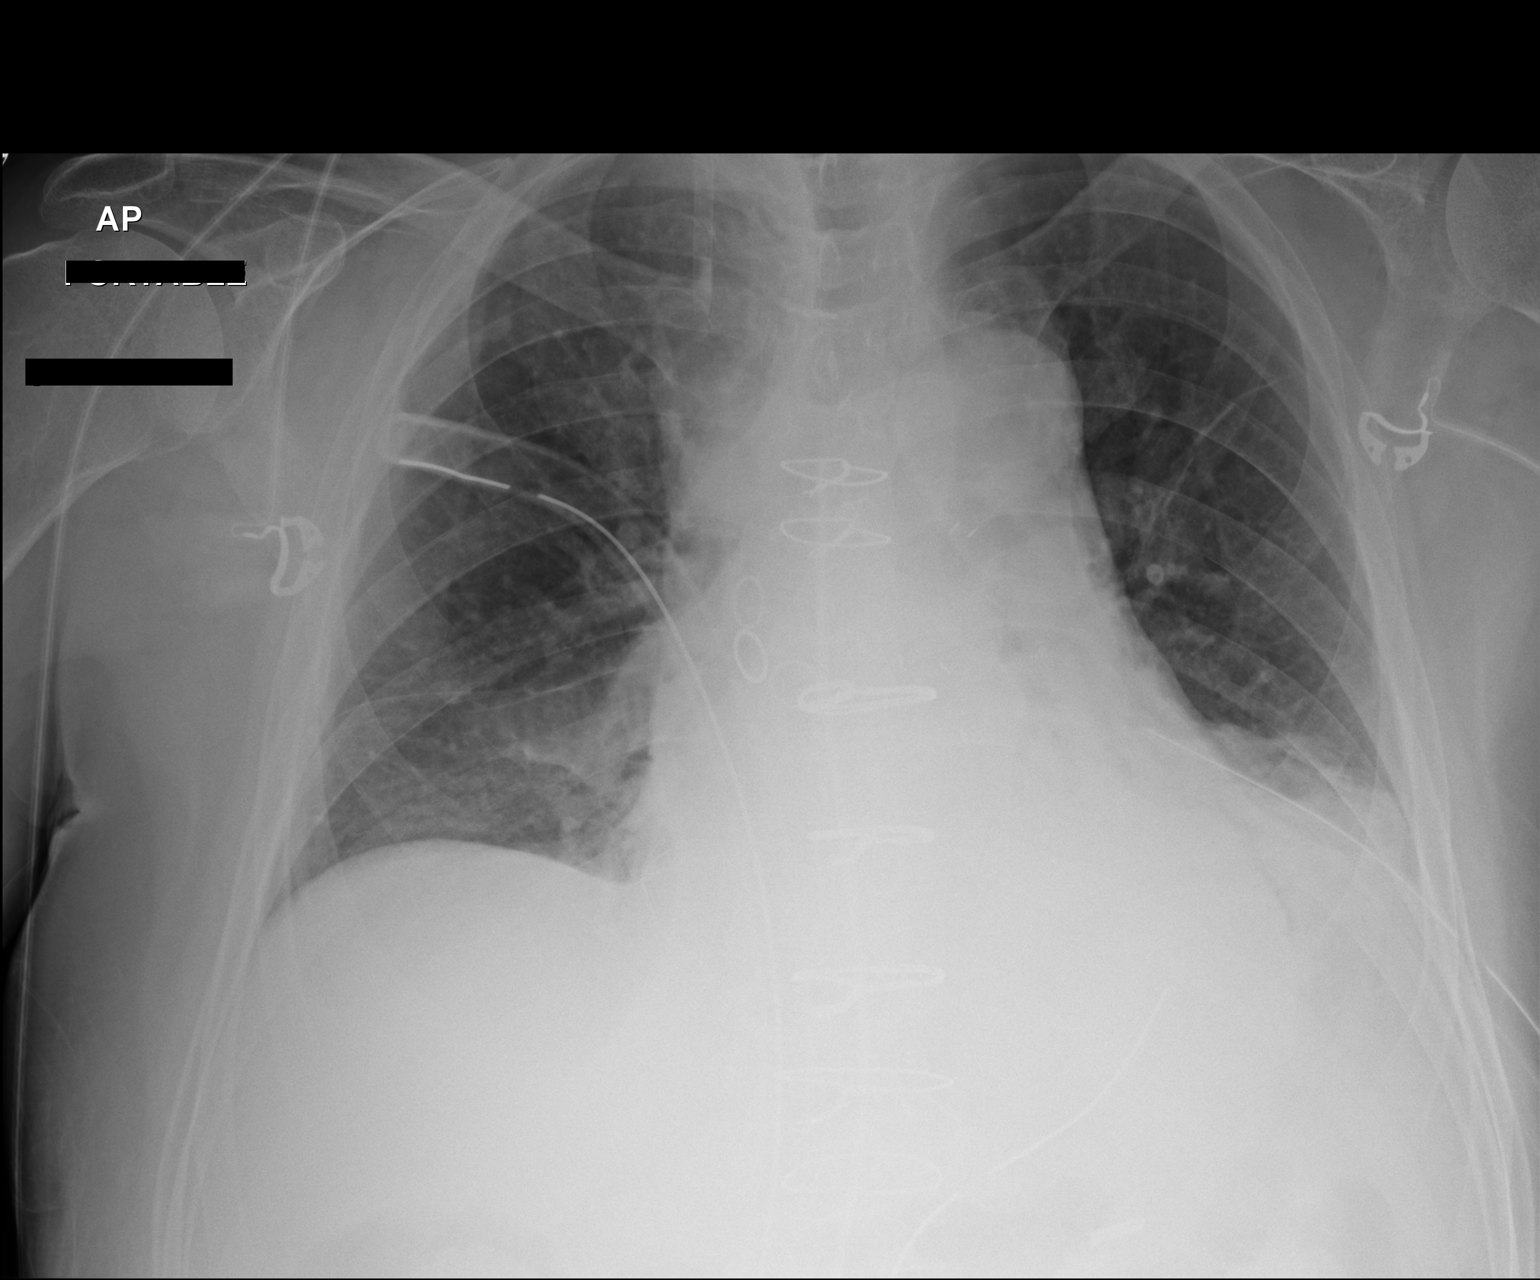

[1 of 1 positions shown; findings below may reference images not displayed]

FINDINGS: Portable AP semi upright view at 9331 hours.

Bilateral chest tubes in place, the left tube may have pulled back
slightly as the side hole now projects at the rib level. No
pneumothorax. Swan-Ganz catheter is been removed. Right IJ
introducer sheath remains. Mediastinal tubes remain.

Stable low lung volumes. Patchy and confluent opacity at the left
base has mildly regressed. No areas of worsening ventilation. Stable
cardiac size and mediastinal contours.
IMPRESSION: 1. Swan-Ganz catheter removed. Bilateral chest tubes remain, the
left tube may have pulled back slightly.
2. No pneumothorax. Atelectasis/consolidation at the left lung base.
3. No new cardiopulmonary abnormality.

## 2016-12-13 IMAGING — DX DG CHEST 2V
2 series · 2 of 2 positions shown · non-contrast
Comparison: 02/07/2015

CLINICAL DATA: Check chest tube placement

EXAM:
CHEST - 2 VIEW

[chest pa]
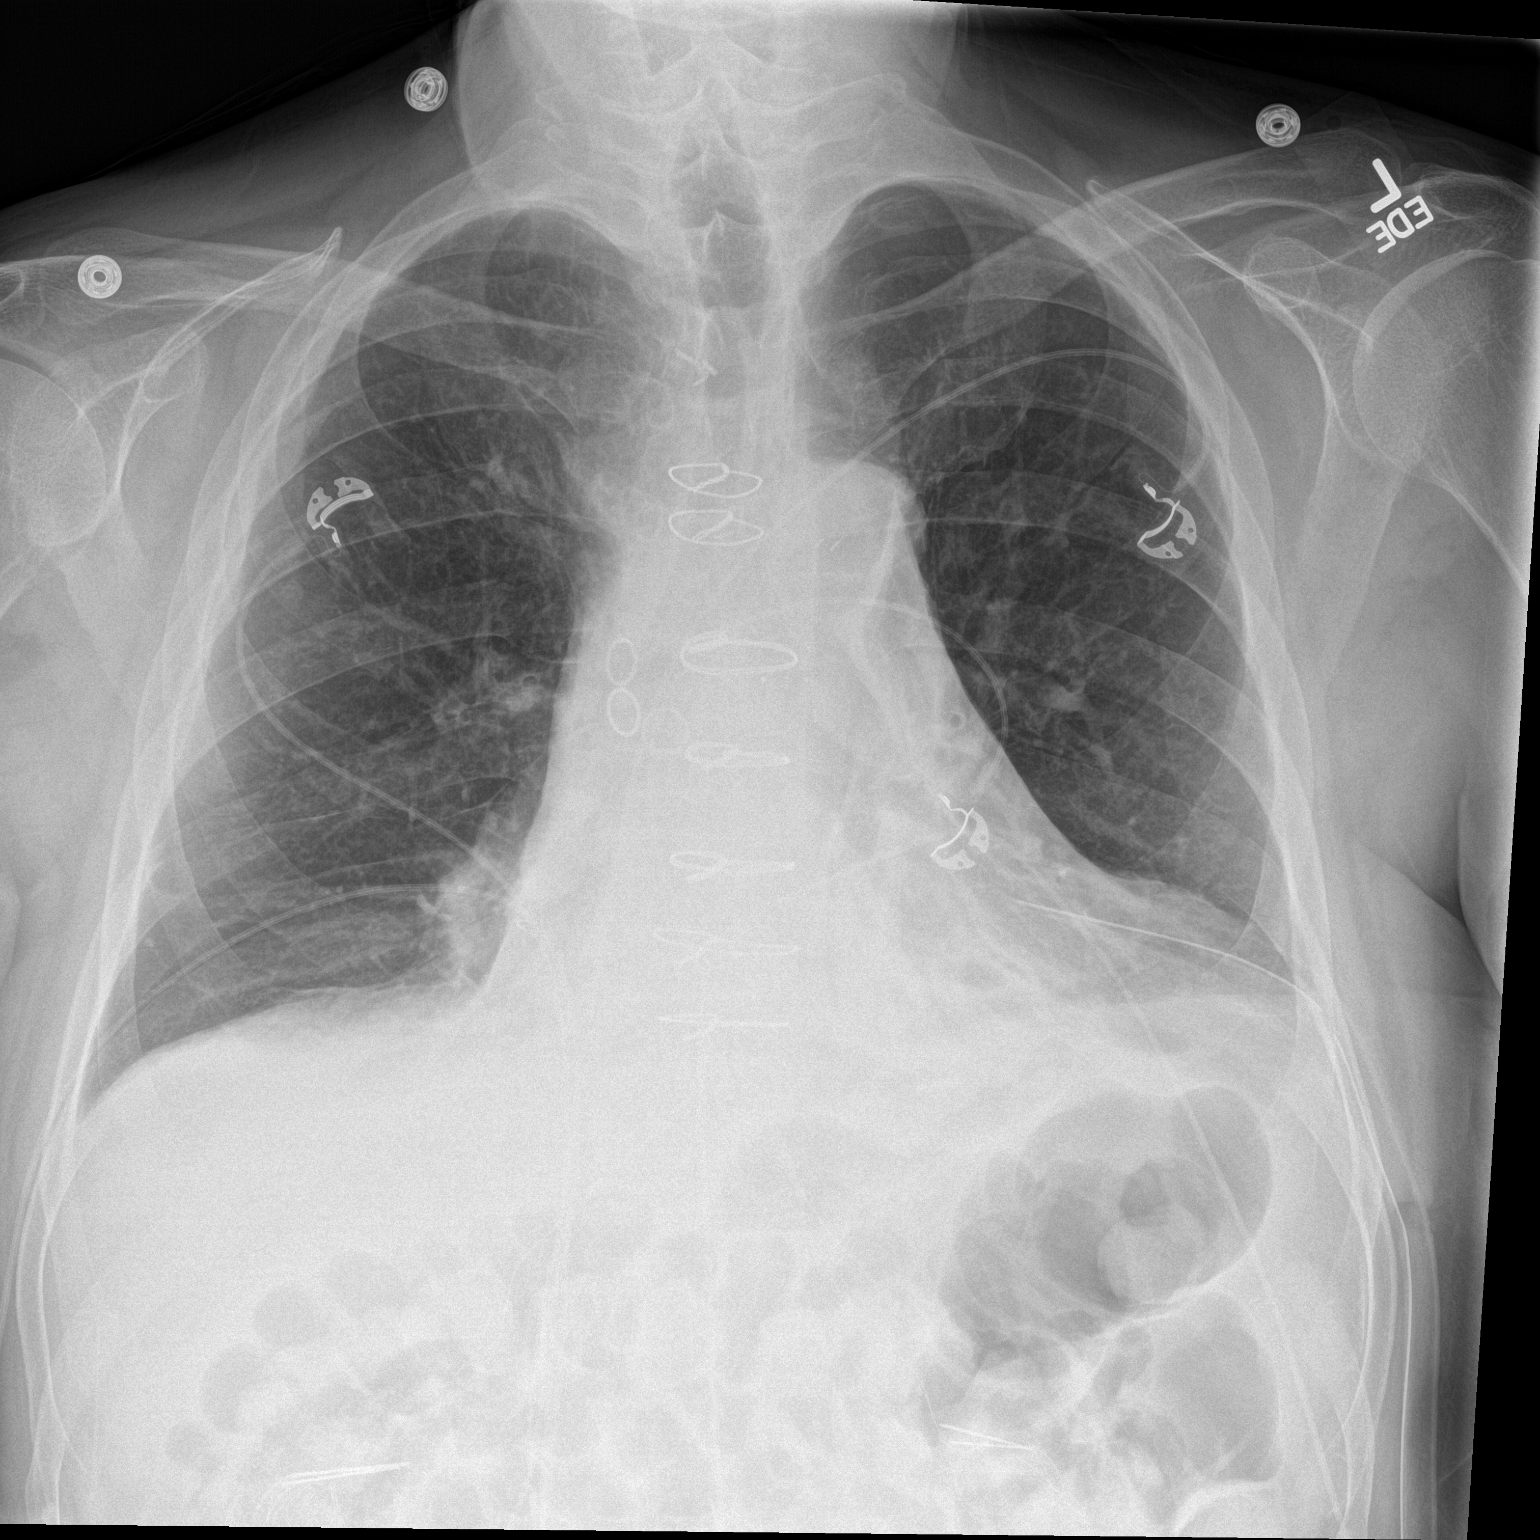

[chest lat]
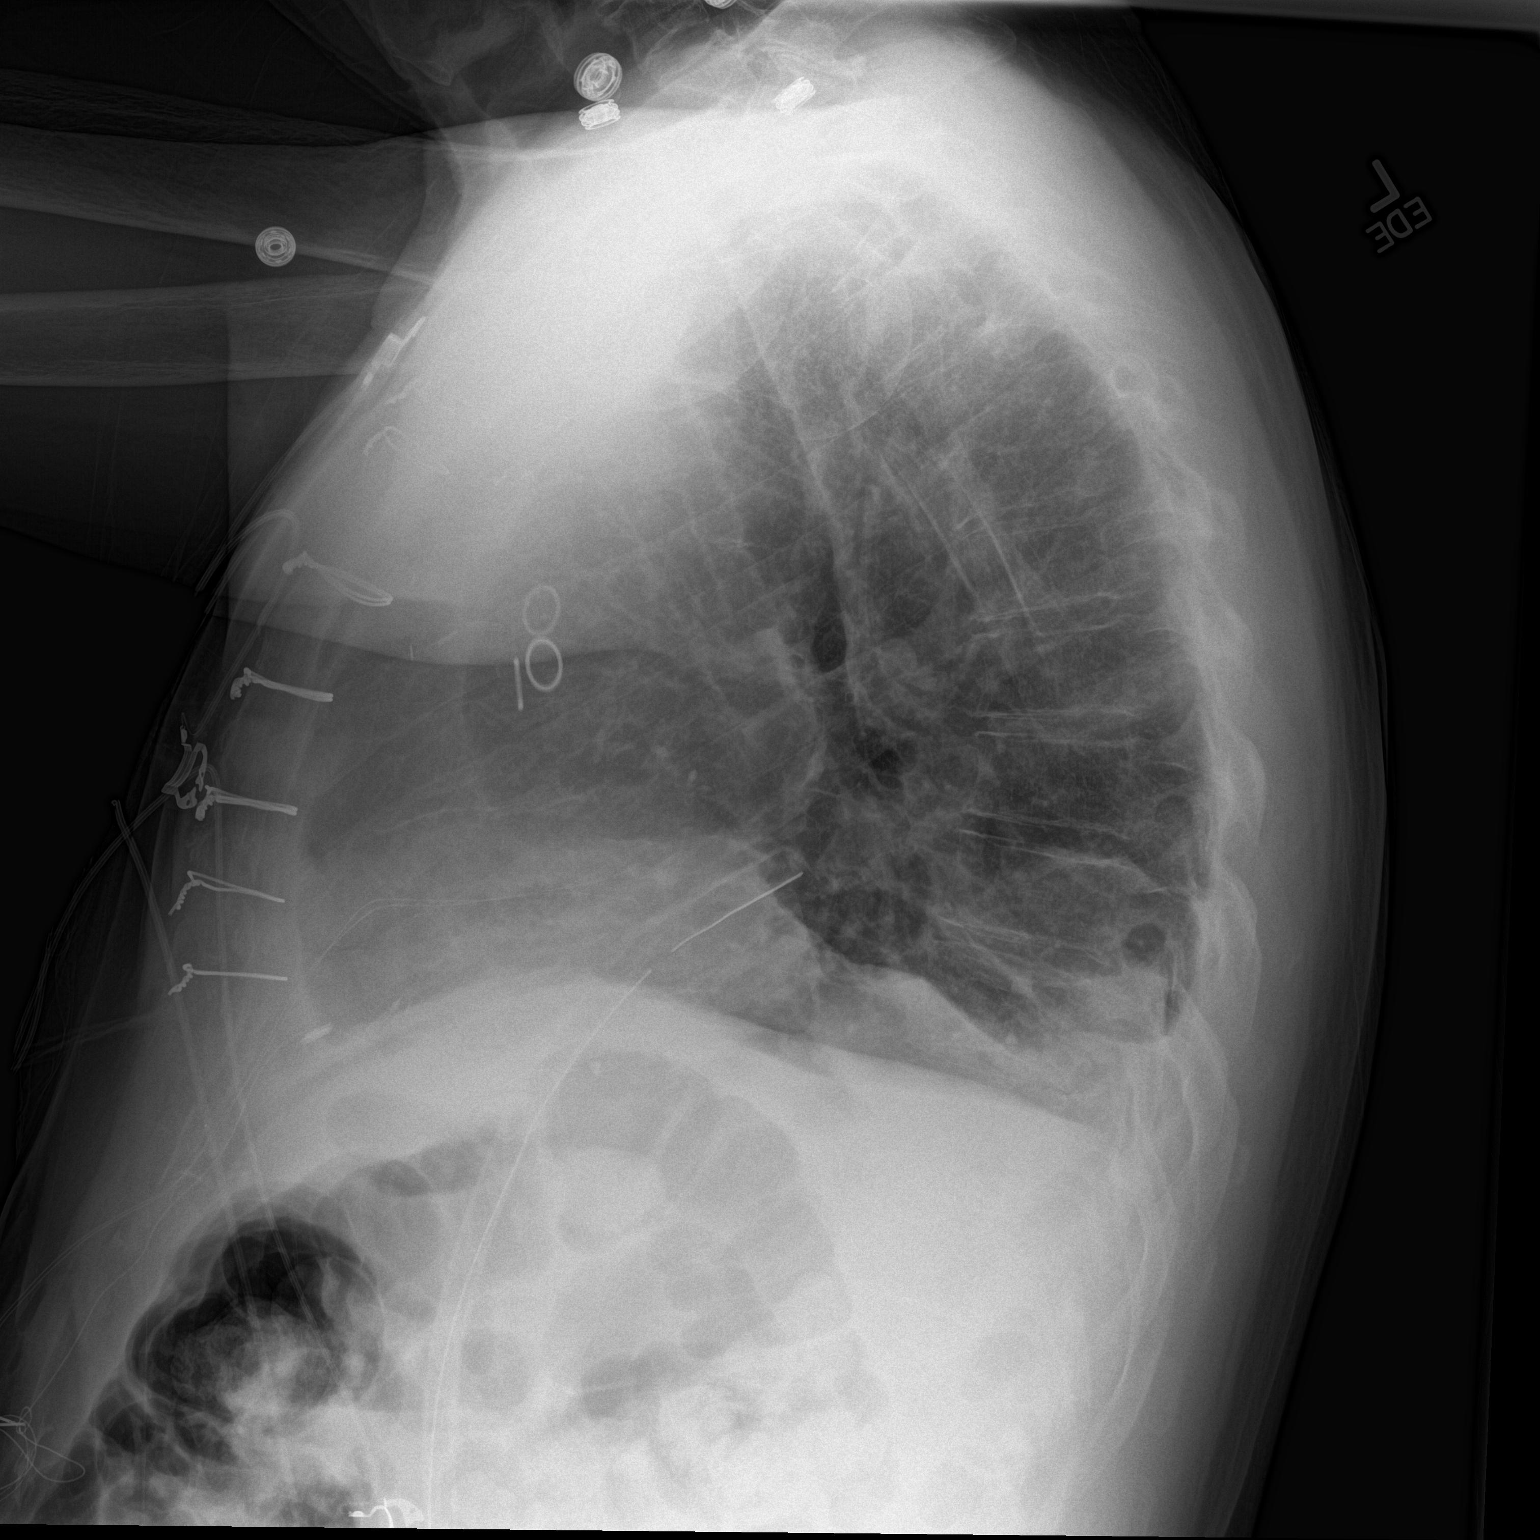

[2 of 2 positions shown; findings below may reference images not displayed]

FINDINGS: Left chest tube is again identified and stable. A previously seen
right-sided chest tube is been removed in the interval. No
pneumothorax is noted. Postsurgical changes are again seen. Improved
aeration in the left lung base is noted although persistent medial
basilar atelectasis remains. No new focal abnormality is seen.
IMPRESSION: No pneumothorax following right chest tube removal.

Persistent left basilar changes although mildly improved.

## 2019-03-25 DEATH — deceased
